# Patient Record
Sex: Female | Born: 1985 | Race: White | Hispanic: No | Marital: Single | Smoking: Never smoker
Health system: Southern US, Community
[De-identification: ages and names within clinical notes are randomized; demographics above are authoritative.]

## PROBLEM LIST (undated history)

## (undated) ENCOUNTER — Inpatient Hospital Stay (HOSPITAL_COMMUNITY): Payer: Self-pay

## (undated) DIAGNOSIS — N898 Other specified noninflammatory disorders of vagina: Secondary | ICD-10-CM

## (undated) DIAGNOSIS — O24419 Gestational diabetes mellitus in pregnancy, unspecified control: Secondary | ICD-10-CM

## (undated) DIAGNOSIS — Z789 Other specified health status: Secondary | ICD-10-CM

## (undated) DIAGNOSIS — E119 Type 2 diabetes mellitus without complications: Secondary | ICD-10-CM

## (undated) DIAGNOSIS — N760 Acute vaginitis: Secondary | ICD-10-CM

## (undated) DIAGNOSIS — N946 Dysmenorrhea, unspecified: Secondary | ICD-10-CM

## (undated) DIAGNOSIS — B9689 Other specified bacterial agents as the cause of diseases classified elsewhere: Secondary | ICD-10-CM

## (undated) DIAGNOSIS — N888 Other specified noninflammatory disorders of cervix uteri: Secondary | ICD-10-CM

## (undated) DIAGNOSIS — N923 Ovulation bleeding: Principal | ICD-10-CM

## (undated) HISTORY — DX: Other specified bacterial agents as the cause of diseases classified elsewhere: B96.89

## (undated) HISTORY — DX: Other specified noninflammatory disorders of cervix uteri: N88.8

## (undated) HISTORY — DX: Dysmenorrhea, unspecified: N94.6

## (undated) HISTORY — DX: Ovulation bleeding: N92.3

## (undated) HISTORY — DX: Other specified health status: Z78.9

## (undated) HISTORY — DX: Gestational diabetes mellitus in pregnancy, unspecified control: O24.419

## (undated) HISTORY — DX: Other specified noninflammatory disorders of vagina: N89.8

## (undated) HISTORY — PX: ORTHOPEDIC SURGERY: SHX850

## (undated) HISTORY — DX: Acute vaginitis: N76.0

## (undated) HISTORY — DX: Type 2 diabetes mellitus without complications: E11.9

---
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
## 2006-01-15 ENCOUNTER — Inpatient Hospital Stay (HOSPITAL_COMMUNITY): Admission: AD | Admit: 2006-01-15 | Discharge: 2006-01-17 | Payer: Self-pay | Admitting: Gynecology

## 2006-03-02 ENCOUNTER — Other Ambulatory Visit: Admission: RE | Admit: 2006-03-02 | Discharge: 2006-03-02 | Payer: Self-pay | Admitting: Gynecology

## 2007-06-24 ENCOUNTER — Emergency Department (HOSPITAL_COMMUNITY): Admission: EM | Admit: 2007-06-24 | Discharge: 2007-06-24 | Payer: Self-pay | Admitting: Emergency Medicine

## 2007-07-10 ENCOUNTER — Emergency Department (HOSPITAL_COMMUNITY): Admission: EM | Admit: 2007-07-10 | Discharge: 2007-07-10 | Payer: Self-pay | Admitting: Emergency Medicine

## 2010-06-30 ENCOUNTER — Other Ambulatory Visit: Payer: Self-pay | Admitting: Obstetrics & Gynecology

## 2010-06-30 ENCOUNTER — Other Ambulatory Visit (HOSPITAL_COMMUNITY)
Admission: RE | Admit: 2010-06-30 | Discharge: 2010-06-30 | Disposition: A | Discharge status: Home / Self Care | Payer: Self-pay | Source: Ambulatory Visit | Attending: Obstetrics & Gynecology | Admitting: Obstetrics & Gynecology

## 2010-06-30 DIAGNOSIS — Z01419 Encounter for gynecological examination (general) (routine) without abnormal findings: Secondary | ICD-10-CM | POA: Insufficient documentation

## 2010-08-29 NOTE — Op Note (Signed)
[name], [name]             ACCOUNT NO.:  [id number]   MEDICAL RECORD NO.:  [id number]          PATIENT TYPE:  OIB   LOCATION:  2899                         FACILITY:  [facility]   PHYSICIAN:  Jon Gills, M.D.  DATE OF BIRTH:  1985-12-02   DATE OF PROCEDURE:  04/18/2004  DATE OF DISCHARGE:  04/18/2004                                 OPERATIVE REPORT   PREOPERATIVE DIAGNOSIS:  Lower gastrointestinal bleeding, undetermined  cause.   POSTOPERATIVE DIAGNOSIS:  Mild proctitis, undetermined cause.   NAME OF OPERATION:  Colonoscopy with biopsy.   SURGEON:  Jon Gills, M.D.   ASSISTANTS:  None.   DESCRIPTION OF FINDINGS:  Following informed written consent, the patient  was taken to the operating room and placed under general anesthesia with  continuous cardiopulmonary monitoring.  She remained in the supine position  and examination of the perineum revealed no tags or fissures.  Digital  examination revealed an empty rectal vault.  The Olympus PCF-160 coloscope  was inserted per rectum and advanced 100 cm to the hepatic flexure.  Mild  inflammation was noted in the first 10 cm of colon without spontaneous  friability.  The remainder of the mucosa was completely normal.   There was no evidence of blood, polyps or vascular abnormalities.  Multiple  biopsies were taken throughout the colon at 100 cm, 50 cm and 15 cm from the  rectum.  The colonoscope was then withdrawn and the patient was awaken and  taken to the recovery room in satisfactory condition.  She will be released  later today to the care of her parents.   DESCRIPTION OF TECHNIQUE PROCEDURES USED:  Olympus PCF-160 colonoscope with  cold biopsy forceps.   DESCRIPTION OF SPECIMENS REMOVED:  Colon biopsies x 8 (two at 100 cm, two at  50 cm and four at 15 cm from the rectum).      JHC/MEDQ  D:  05/08/2004  T:  05/08/2004  Job:  [id number]   cc:   Gaetano Hawthorne. Lily Peer, M.D.  [address], [address]  [place]  [place] [address]  Fax: [phone]   Vale Haven. Andrey Campanile, M.D.  [address]  [place]  [place] [address]  Fax: [phone]

## 2010-08-29 NOTE — Op Note (Signed)
[facility]  Patient:    [name], [name] Visit Number: [id number] MRN: [id number]          Service Type: DSU Location: [facility] Attending Physician:  Georgena Spurling Dictated by:   Georgena Spurling, M.D. Proc. Date: 02/16/01 Admit Date:  02/16/2001                             Operative Report  PREOPERATIVE DIAGNOSIS:  Left knee patella instability.  POSTOPERATIVE DIAGNOSIS:  Left knee patella instability.  PROCEDURE:  Left knee arthroscopy with arthroscopic lateral release.  SURGEON:  Georgena Spurling, M.D.  ANESTHESIA:  General LMA.  INDICATION FOR PROCEDURE:  The patient is a 25 year old status post subluxation of the knee and dislocation of the patella.  Informed consent was obtained.  DESCRIPTION OF PROCEDURE:  The patient was laid supine and administered general LMA anesthesia.  The left lower extremity was prepped and draped in the usual sterile fashion.  Inferior lateral and inferior medial portals were created with a #11 blade, blunt trocar, and cannula.  Diagnostic arthroscopy revealed an avulsion of cartilage off of the medial facet of the patella during a dislocation episode.  There was also an extreme amount of synovitis. ACL, PCL were normal.  The medial and lateral menisci were normal.  There was a loose piece of cartilage down in the intercondylar notch, and this was removed.  then used the Arthrocare wand to perform a lateral release.  What was quite noticeable was that the patient had very little, if any, true sulcus in the trochlear area.  This was obviously the etiology of her patellar instability. The kneecap was tracking somewhat laterally, and the lateral release did help; however, during the tracking process, although it was central, there was no sulcus, really no congruity of the patella, for the patella to track in.  The patella looked pretty good.  It had some softening in the lateral facet.  The knee was then lavaged.   Both portals were closed with interrupted 4-0 nylon sutures and infiltrated with 10 cc of a Marcaine-morphine mixture.  The wounds were dressed with a Xeroform dressing, sponges, sterile Webril, and an Ace wrap.  Tourniquet time:  None. Complications:  None.  Drains:  None. Dictated by:   Georgena Spurling, M.D. Attending Physician:  Georgena Spurling DD:  02/16/01 TD:  02/17/01 Job: [id number]

## 2010-08-29 NOTE — H&P (Signed)
[name], [name]             ACCOUNT NO.:  [id number]   MEDICAL RECORD NO.:  [id number]          PATIENT TYPE:  INP   LOCATION:                                FACILITY:  [facility]   PHYSICIAN:  Juan H. Lily Peer, M.D.DATE OF BIRTH:  01/18/2005   DATE OF ADMISSION:  DATE OF DISCHARGE:                                HISTORY & PHYSICAL   CHIEF COMPLAINT:  Postdate pregnancy.   HISTORY:  The patient is an 25 year old gravida 1, para 0 with uncertain  last menstrual period.  Ultrasound demonstrated her due date to be January 10, 2006.  She was seen in the office today for a routine OB visit.  She is  currently 40-1/[redacted] weeks gestation and her cervix was found to be 2 cm, 80%  effaced and -3 station.  Positive fetal movements were documented.  The  patient has had a benign prenatal course, had declined first trimester  screening and had Ascus on her Pap smear and negative HPV and we will follow  the Pap smear postpartum.  She is Rubella negative.  So, we will have to  treat her postpartum with a vaccination.   PAST MEDICAL HISTORY:  The patient denies any allergies.  No medical  problems reported.  She is on prenatal vitamins.   REVIEW OF SYSTEMS:  See [employer] form.   PHYSICAL EXAMINATION:  Weight 160 pounds.  Urine was negative for protein  and glucose.  Blood pressure was 130/80.  HEENT:  Unremarkable.  NECK:  Supple.  Trachea midline.  No carotid bruits.  No thyromegaly.  LUNGS:  Clear to auscultation without rhonchi or wheezes.  HEART:  Regular rate and rhythm.  No murmurs or gallops.  BREASTS:  Exam not done.  ABDOMEN:  Gravid uterus.  Vertex presentation by [facility] maneuver.  PELVIC:  Cervix 2 cm, 80% effaced, -3 station.  EXTREMITIES:  DTR 1+, negative clonus.   PRENATAL LABS:  A positive blood type, negative antibody screen.  VDRL was  nonreactive.  Rubella negative.  Hepatitis B surface antigen and HIV were  negative.  Alphafetaprotein was normal.  Diabetes screen was  normal.  GBS  and culture was negative.  The patient declined first trimester screening  and cystic fibrosis screening.  A group B strep culture was negative.   ASSESSMENT:  An 25 year old gravida 1, para 0 at [redacted] weeks gestation.  At  this time, she will be admitted to the hospital on the morning of January 18, 2006 for Pitocin and rupturing of membranes.  She has a favorable cervix at  2  cm, 80% effaced, -3 station.  Risks, benefits, pros and cons were discussed.  Group B strep culture was negative.  We will follow accordingly.   PLAN:  As per assessment above.      Juan H. Lily Peer, M.D.  Electronically Signed     JHF/MEDQ  D:  01/14/2006  T:  01/14/2006  Job:  [id number]

## 2011-01-05 LAB — URINALYSIS, ROUTINE W REFLEX MICROSCOPIC
Bilirubin Urine: NEGATIVE
Bilirubin Urine: NEGATIVE
Glucose, UA: NEGATIVE
Glucose, UA: NEGATIVE
Hgb urine dipstick: NEGATIVE
Hgb urine dipstick: NEGATIVE
Ketones, ur: NEGATIVE
Nitrite: NEGATIVE
Protein, ur: NEGATIVE
Specific Gravity, Urine: 1.02
Specific Gravity, Urine: >1.03 — ABNORMAL HIGH
Urobilinogen, UA: 0.2
Urobilinogen, UA: 0.2
pH: 7.5

## 2011-01-05 LAB — URINE MICROSCOPIC-ADD ON

## 2011-01-05 LAB — PREGNANCY, URINE: Preg Test, Ur: NEGATIVE

## 2011-01-05 LAB — URINE CULTURE: Colony Count: >=100000

## 2011-02-19 ENCOUNTER — Other Ambulatory Visit: Payer: Self-pay | Admitting: Adult Health

## 2011-02-19 DIAGNOSIS — N63 Unspecified lump in unspecified breast: Secondary | ICD-10-CM

## 2011-02-25 ENCOUNTER — Ambulatory Visit (HOSPITAL_COMMUNITY)
Admission: RE | Admit: 2011-02-25 | Discharge: 2011-02-25 | Disposition: A | Discharge status: Home / Self Care | Payer: BC Managed Care – PPO | Source: Ambulatory Visit | Attending: Adult Health | Admitting: Adult Health

## 2011-02-25 DIAGNOSIS — N63 Unspecified lump in unspecified breast: Secondary | ICD-10-CM | POA: Insufficient documentation

## 2012-04-13 NOTE — L&D Delivery Note (Signed)
Delivery Note At 9:33 AM a viable female was delivered via Vaginal, Spontaneous Delivery (Presentation: Right Occiput Anterior).  APGAR: 9, 9; weight TBD.   Placenta status: Intact, Spontaneous.  Cord: 3 vessels with the following complications: None.  Cord pH: not sent  Anesthesia: Epidural  Episiotomy: None Lacerations: 1st degree Suture Repair: 3.0 vicryl Est. Blood Loss (mL): 300  Mom to postpartum.  Baby to nursery-stable.  Marissa Calamity 01/02/2013, 9:48 AM   I was present for and supervised the delivery of this newborn by the resident. Pt pushed with good maternal effort to deliver a liveborn female with spontaneous cry. 1st degree repaired with figure of 8 stitch for hemostasis. Placenta  Delivered spontaneously intact.  Mom and baby doing well.   Kaitlyn Skowron, Redmond Baseman, MD

## 2012-05-04 ENCOUNTER — Emergency Department (HOSPITAL_COMMUNITY): Payer: BC Managed Care – PPO

## 2012-05-04 ENCOUNTER — Encounter (HOSPITAL_COMMUNITY): Payer: Self-pay | Admitting: *Deleted

## 2012-05-04 ENCOUNTER — Emergency Department (HOSPITAL_COMMUNITY)
Admission: EM | Admit: 2012-05-04 | Discharge: 2012-05-04 | Disposition: A | Discharge status: Home / Self Care | Payer: BC Managed Care – PPO | Attending: Emergency Medicine | Admitting: Emergency Medicine

## 2012-05-04 DIAGNOSIS — Z3201 Encounter for pregnancy test, result positive: Secondary | ICD-10-CM | POA: Insufficient documentation

## 2012-05-04 DIAGNOSIS — N390 Urinary tract infection, site not specified: Secondary | ICD-10-CM | POA: Insufficient documentation

## 2012-05-04 DIAGNOSIS — R110 Nausea: Secondary | ICD-10-CM | POA: Insufficient documentation

## 2012-05-04 LAB — CBC WITH DIFFERENTIAL/PLATELET
Basophils Relative: 0 % (ref 0–1)
Eosinophils Absolute: 0.1 10*3/uL (ref 0.0–0.7)
HCT: 39.3 % (ref 36.0–46.0)
Hemoglobin: 13.2 g/dL (ref 12.0–15.0)
Lymphs Abs: 2.2 10*3/uL (ref 0.7–4.0)
MCH: 30.9 pg (ref 26.0–34.0)
MCHC: 33.6 g/dL (ref 30.0–36.0)
MCV: 92 fL (ref 78.0–100.0)
Monocytes Absolute: 0.9 10*3/uL (ref 0.1–1.0)
Monocytes Relative: 10 % (ref 3–12)
RBC: 4.27 MIL/uL (ref 3.87–5.11)

## 2012-05-04 LAB — URINALYSIS, ROUTINE W REFLEX MICROSCOPIC
Glucose, UA: NEGATIVE mg/dL
Ketones, ur: NEGATIVE mg/dL
Nitrite: NEGATIVE
Protein, ur: NEGATIVE mg/dL
pH: 8 (ref 5.0–8.0)

## 2012-05-04 LAB — COMPREHENSIVE METABOLIC PANEL
Albumin: 3.8 g/dL (ref 3.5–5.2)
Alkaline Phosphatase: 71 U/L (ref 39–117)
BUN: 10 mg/dL (ref 6–23)
Creatinine, Ser: 0.73 mg/dL (ref 0.50–1.10)
GFR calc Af Amer: >90 mL/min (ref >90)
Glucose, Bld: 89 mg/dL (ref 70–99)
Potassium: 3.5 mEq/L (ref 3.5–5.1)
Total Bilirubin: 0.6 mg/dL (ref 0.3–1.2)
Total Protein: 7.4 g/dL (ref 6.0–8.3)

## 2012-05-04 LAB — HCG, QUANTITATIVE, PREGNANCY: hCG, Beta Chain, Quant, S: 5074 m[IU]/mL — ABNORMAL HIGH (ref <5)

## 2012-05-04 LAB — URINE MICROSCOPIC-ADD ON

## 2012-05-04 MED ORDER — CEPHALEXIN 500 MG PO CAPS: 500.0000 mg | ORAL_CAPSULE | Freq: Four times a day (QID) | ORAL | Status: DC

## 2012-05-04 NOTE — ED Provider Notes (Signed)
History   This chart was scribed for Joann Lennert, MD by Charolett Bumpers, ED Scribe. The patient was seen in room APA17/APA17. Patient's care was started at 1121.   CSN: [id number]  Arrival date & time 05/04/12  1111   First MD Initiated Contact with Patient 05/04/12 1121      Chief Complaint  Patient presents with  . Abdominal Pain   [name] is a 27 y.o. female who presents to the Emergency Department complaining of lower abdominal pain. She states her pain started 2 days ago. She took a gas-x which resolved her symptoms. She states her pain returned this morning and has not stopped since. She describes the pain as pressure. She reports some mild nausea and notes she has had an episode of mild blood in her stool. She denies any fever, chills, vomiting, diarrhea, dysuria or increased urinary frequency. She denies any abdominal surgery hx. LNMP was 04/10/12  Patient is a 27 y.o. female presenting with abdominal pain. The history is provided by the patient. No language interpreter was used.  Abdominal Pain The primary symptoms of the illness include abdominal pain and nausea. The primary symptoms of the illness do not include fever, fatigue, vomiting or diarrhea.  The abdominal pain began 2 days ago. The pain came on gradually. The abdominal pain has been gradually worsening since its onset. The abdominal pain is located in the LLQ and suprapubic region. The abdominal pain does not radiate.  Symptoms associated with the illness do not include chills, hematuria, frequency or back pain.    History reviewed. No pertinent past medical history.  Past Surgical History  Procedure Date  . Orthopedic surgery     No family history on file.  History  Substance Use Topics  . Smoking status: Never Smoker   . Smokeless tobacco: Not on file  . Alcohol Use: Yes     Comment: OCC    OB History    Grav Para Term Preterm Abortions TAB SAB Ect Mult Living                   Review of Systems  Constitutional: Negative for fever, chills and fatigue.  HENT: Negative for congestion, sinus pressure and ear discharge.   Eyes: Negative for discharge.  Respiratory: Negative for cough.   Cardiovascular: Negative for chest pain.  Gastrointestinal: Positive for nausea, abdominal pain and blood in stool. Negative for vomiting and diarrhea.  Genitourinary: Negative for frequency and hematuria.  Musculoskeletal: Negative for back pain.  Skin: Negative for rash.  Neurological: Negative for seizures and headaches.  Hematological: Negative.   Psychiatric/Behavioral: Negative for hallucinations.  All other systems reviewed and are negative.    Allergies  Review of patient's allergies indicates no known allergies.  Home Medications  No current outpatient prescriptions on file.  BP 121/62  Pulse 83  Temp 98.1 F (36.7 C) (Oral)  Resp 20  Ht 5\' 2"  (1.575 m)  Wt 185 lb (83.915 kg)  BMI 33.84 kg/m2  SpO2 97%  LMP 04/10/2012  Physical Exam  Nursing note and vitals reviewed. Constitutional: She is oriented to person, place, and time. She appears well-developed.  HENT:  Head: Normocephalic and atraumatic.  Eyes: Conjunctivae normal and EOM are normal. No scleral icterus.  Neck: Neck supple. No thyromegaly present.  Cardiovascular: Normal rate, regular rhythm and normal heart sounds.  Exam reveals no gallop and no friction rub.   No murmur heard. Pulmonary/Chest: Effort normal and breath sounds normal.  No stridor. She has no wheezes. She has no rales. She exhibits no tenderness.  Abdominal: Soft. She exhibits no distension. There is tenderness. There is no rebound.       Minimal LLQ and suprapubic tenderness.   Genitourinary: Cervix exhibits motion tenderness. Left adnexum displays tenderness. Vaginal discharge found.       Cervical and left adnexa tenderness with mild discharge noted.   Musculoskeletal: Normal range of motion. She exhibits no edema.   Lymphadenopathy:    She has no cervical adenopathy.  Neurological: She is oriented to person, place, and time. Coordination normal.  Skin: No rash noted. No erythema.  Psychiatric: She has a normal mood and affect. Her behavior is normal.    ED Course  Procedures (including critical care time)  DIAGNOSTIC STUDIES: Oxygen Saturation is 97% on room air, adequate by my interpretation.    COORDINATION OF CARE:  11:27-Discussed planned course of treatment with the patient including blood work and UA, who is agreeable at this time.   12:08-Preformed pelvic exam with chaperon present.   Results for orders placed during the hospital encounter of 05/04/12  PREGNANCY, URINE      Component Value Range   Preg Test, Ur POSITIVE (*) NEGATIVE  CBC WITH DIFFERENTIAL      Component Value Range   WBC 8.9  4.0 - 10.5 K/uL   RBC 4.27  3.87 - 5.11 MIL/uL   Hemoglobin 13.2  12.0 - 15.0 g/dL   HCT 16.1  09.6 - 04.5 %   MCV 92.0  78.0 - 100.0 fL   MCH 30.9  26.0 - 34.0 pg   MCHC 33.6  30.0 - 36.0 g/dL   RDW 40.9  81.1 - 91.4 %   Platelets 420 (*) 150 - 400 K/uL   Neutrophils Relative 64  43 - 77 %   Neutro Abs 5.6  1.7 - 7.7 K/uL   Lymphocytes Relative 25  12 - 46 %   Lymphs Abs 2.2  0.7 - 4.0 K/uL   Monocytes Relative 10  3 - 12 %   Monocytes Absolute 0.9  0.1 - 1.0 K/uL   Eosinophils Relative 1  0 - 5 %   Eosinophils Absolute 0.1  0.0 - 0.7 K/uL   Basophils Relative 0  0 - 1 %   Basophils Absolute 0.0  0.0 - 0.1 K/uL  COMPREHENSIVE METABOLIC PANEL      Component Value Range   Sodium 134 (*) 135 - 145 mEq/L   Potassium 3.5  3.5 - 5.1 mEq/L   Chloride 99  96 - 112 mEq/L   CO2 24  19 - 32 mEq/L   Glucose, Bld 89  70 - 99 mg/dL   BUN 10  6 - 23 mg/dL   Creatinine, Ser 7.82  0.50 - 1.10 mg/dL   Calcium 9.3  8.4 - 95.6 mg/dL   Total Protein 7.4  6.0 - 8.3 g/dL   Albumin 3.8  3.5 - 5.2 g/dL   AST 24  0 - 37 U/L   ALT 22  0 - 35 U/L   Alkaline Phosphatase 71  39 - 117 U/L   Total  Bilirubin 0.6  0.3 - 1.2 mg/dL   GFR calc non Af Amer >90  >90 mL/min   GFR calc Af Amer >90  >90 mL/min  URINALYSIS, ROUTINE W REFLEX MICROSCOPIC      Component Value Range   Color, Urine YELLOW  YELLOW   APPearance CLEAR  CLEAR   Specific  Gravity, Urine 1.020  1.005 - 1.030   pH 8.0  5.0 - 8.0   Glucose, UA NEGATIVE  NEGATIVE mg/dL   Hgb urine dipstick NEGATIVE  NEGATIVE   Bilirubin Urine NEGATIVE  NEGATIVE   Ketones, ur NEGATIVE  NEGATIVE mg/dL   Protein, ur NEGATIVE  NEGATIVE mg/dL   Urobilinogen, UA 0.2  0.0 - 1.0 mg/dL   Nitrite NEGATIVE  NEGATIVE   Leukocytes, UA SMALL (*) NEGATIVE  URINE MICROSCOPIC-ADD ON      Component Value Range   Squamous Epithelial / LPF FEW (*) RARE   WBC, UA 3-6  <3 WBC/hpf   Bacteria, UA MANY (*) RARE  HCG, QUANTITATIVE, PREGNANCY      Component Value Range   hCG, Beta Chain, Quant, S 5074 (*) <5 mIU/mL    No results found.   No diagnosis found.    MDM      The chart was scribed for me under my direct supervision.  I personally performed the history, physical, and medical decision making and all procedures in the evaluation of this patient.Joann Lennert, MD 05/04/12 [phone]

## 2012-05-04 NOTE — ED Notes (Signed)
Abdominal pain began Monday, to lower abdomen. Woke up this morning with pressure  To lower abdomen and slight nausea. NAD.

## 2012-05-05 LAB — URINE CULTURE: Colony Count: 40000

## 2012-06-03 ENCOUNTER — Encounter: Payer: Self-pay | Admitting: Adult Health

## 2012-06-03 ENCOUNTER — Other Ambulatory Visit: Payer: Self-pay | Admitting: Adult Health

## 2012-06-03 ENCOUNTER — Other Ambulatory Visit (HOSPITAL_COMMUNITY)
Admission: RE | Admit: 2012-06-03 | Discharge: 2012-06-03 | Disposition: A | Discharge status: Home / Self Care | Payer: BC Managed Care – PPO | Source: Ambulatory Visit | Attending: Obstetrics and Gynecology | Admitting: Obstetrics and Gynecology

## 2012-06-03 DIAGNOSIS — Z01419 Encounter for gynecological examination (general) (routine) without abnormal findings: Secondary | ICD-10-CM | POA: Insufficient documentation

## 2012-06-03 DIAGNOSIS — Z113 Encounter for screening for infections with a predominantly sexual mode of transmission: Secondary | ICD-10-CM | POA: Insufficient documentation

## 2012-06-03 DIAGNOSIS — O2341 Unspecified infection of urinary tract in pregnancy, first trimester: Secondary | ICD-10-CM

## 2012-06-20 ENCOUNTER — Other Ambulatory Visit: Payer: Self-pay | Admitting: Obstetrics & Gynecology

## 2012-06-20 DIAGNOSIS — Z36 Encounter for antenatal screening of mother: Secondary | ICD-10-CM

## 2012-06-30 ENCOUNTER — Ambulatory Visit (INDEPENDENT_AMBULATORY_CARE_PROVIDER_SITE_OTHER): Payer: BC Managed Care – PPO

## 2012-06-30 ENCOUNTER — Ambulatory Visit (INDEPENDENT_AMBULATORY_CARE_PROVIDER_SITE_OTHER): Payer: BC Managed Care – PPO | Admitting: Obstetrics & Gynecology

## 2012-06-30 ENCOUNTER — Other Ambulatory Visit: Payer: Self-pay | Admitting: Obstetrics & Gynecology

## 2012-06-30 VITALS — BP 120/60 | Wt 203.0 lb

## 2012-06-30 DIAGNOSIS — Z3490 Encounter for supervision of normal pregnancy, unspecified, unspecified trimester: Secondary | ICD-10-CM

## 2012-06-30 DIAGNOSIS — Z36 Encounter for antenatal screening of mother: Secondary | ICD-10-CM

## 2012-06-30 DIAGNOSIS — Z3480 Encounter for supervision of other normal pregnancy, unspecified trimester: Secondary | ICD-10-CM

## 2012-06-30 LAB — POCT URINALYSIS DIPSTICK
Leukocytes, UA: NEGATIVE
Nitrite, UA: NEGATIVE
Protein, UA: NEGATIVE

## 2012-06-30 NOTE — Patient Instructions (Signed)
Pregnancy - Second Trimester The second trimester of pregnancy (3 to 6 months) is a period of rapid growth for you and your baby. At the end of the sixth month, your baby is about 9 inches long and weighs 1 1/2 pounds. You will begin to feel the baby move between 18 and 20 weeks of the pregnancy. This is called quickening. Weight gain is faster. A clear fluid (colostrum) may leak out of your breasts. You may feel small contractions of the womb (uterus). This is known as false labor or Braxton-Hicks contractions. This is like a practice for labor when the baby is ready to be born. Usually, the problems with morning sickness have usually passed by the end of your first trimester. Some women develop small dark blotches (called cholasma, mask of pregnancy) on their face that usually goes away after the baby is born. Exposure to the sun makes the blotches worse. Acne may also develop in some pregnant women and pregnant women who have acne, may find that it goes away. PRENATAL EXAMS  Blood work may continue to be done during prenatal exams. These tests are done to check on your health and the probable health of your baby. Blood work is used to follow your blood levels (hemoglobin). Anemia (low hemoglobin) is common during pregnancy. Iron and vitamins are given to help prevent this. You will also be checked for diabetes between 24 and 28 weeks of the pregnancy. Some of the previous blood tests may be repeated.  The size of the uterus is measured during each visit. This is to make sure that the baby is continuing to grow properly according to the dates of the pregnancy.  Your blood pressure is checked every prenatal visit. This is to make sure you are not getting toxemia.  Your urine is checked to make sure you do not have an infection, diabetes or protein in the urine.  Your weight is checked often to make sure gains are happening at the suggested rate. This is to ensure that both you and your baby are growing  normally.  Sometimes, an ultrasound is performed to confirm the proper growth and development of the baby. This is a test which bounces harmless sound waves off the baby so your caregiver can more accurately determine due dates. Sometimes, a specialized test is done on the amniotic fluid surrounding the baby. This test is called an amniocentesis. The amniotic fluid is obtained by sticking a needle into the belly (abdomen). This is done to check the chromosomes in instances where there is a concern about possible genetic problems with the baby. It is also sometimes done near the end of pregnancy if an early delivery is required. In this case, it is done to help make sure the baby's lungs are mature enough for the baby to live outside of the womb. CHANGES OCCURING IN THE SECOND TRIMESTER OF PREGNANCY Your body goes through many changes during pregnancy. They vary from person to person. Talk to your caregiver about changes you notice that you are concerned about.  During the second trimester, you will likely have an increase in your appetite. It is normal to have cravings for certain foods. This varies from person to person and pregnancy to pregnancy.  Your lower abdomen will begin to bulge.  You may have to urinate more often because the uterus and baby are pressing on your bladder. It is also common to get more bladder infections during pregnancy (pain with urination). You can help this by   drinking lots of fluids and emptying your bladder before and after intercourse.  You may begin to get stretch marks on your hips, abdomen, and breasts. These are normal changes in the body during pregnancy. There are no exercises or medications to take that prevent this change.  You may begin to develop swollen and bulging veins (varicose veins) in your legs. Wearing support hose, elevating your feet for 15 minutes, 3 to 4 times a day and limiting salt in your diet helps lessen the problem.  Heartburn may develop  as the uterus grows and pushes up against the stomach. Antacids recommended by your caregiver helps with this problem. Also, eating smaller meals 4 to 5 times a day helps.  Constipation can be treated with a stool softener or adding bulk to your diet. Drinking lots of fluids, vegetables, fruits, and whole grains are helpful.  Exercising is also helpful. If you have been very active up until your pregnancy, most of these activities can be continued during your pregnancy. If you have been less active, it is helpful to start an exercise program such as walking.  Hemorrhoids (varicose veins in the rectum) may develop at the end of the second trimester. Warm sitz baths and hemorrhoid cream recommended by your caregiver helps hemorrhoid problems.  Backaches may develop during this time of your pregnancy. Avoid heavy lifting, wear low heal shoes and practice good posture to help with backache problems.  Some pregnant women develop tingling and numbness of their hand and fingers because of swelling and tightening of ligaments in the wrist (carpel tunnel syndrome). This goes away after the baby is born.  As your breasts enlarge, you may have to get a bigger bra. Get a comfortable, cotton, support bra. Do not get a nursing bra until the last month of the pregnancy if you will be nursing the baby.  You may get a dark line from your belly button to the pubic area called the linea nigra.  You may develop rosy cheeks because of increase blood flow to the face.  You may develop spider looking lines of the face, neck, arms and chest. These go away after the baby is born. HOME CARE INSTRUCTIONS   It is extremely important to avoid all smoking, herbs, alcohol, and unprescribed drugs during your pregnancy. These chemicals affect the formation and growth of the baby. Avoid these chemicals throughout the pregnancy to ensure the delivery of a healthy infant.  Most of your home care instructions are the same as  suggested for the first trimester of your pregnancy. Keep your caregiver's appointments. Follow your caregiver's instructions regarding medication use, exercise and diet.  During pregnancy, you are providing food for you and your baby. Continue to eat regular, well-balanced meals. Choose foods such as meat, fish, milk and other low fat dairy products, vegetables, fruits, and whole-grain breads and cereals. Your caregiver will tell you of the ideal weight gain.  A physical sexual relationship may be continued up until near the end of pregnancy if there are no other problems. Problems could include early (premature) leaking of amniotic fluid from the membranes, vaginal bleeding, abdominal pain, or other medical or pregnancy problems.  Exercise regularly if there are no restrictions. Check with your caregiver if you are unsure of the safety of some of your exercises. The greatest weight gain will occur in the last 2 trimesters of pregnancy. Exercise will help you:  Control your weight.  Get you in shape for labor and delivery.  Lose weight   after you have the baby.  Wear a good support or jogging bra for breast tenderness during pregnancy. This may help if worn during sleep. Pads or tissues may be used in the bra if you are leaking colostrum.  Do not use hot tubs, steam rooms or saunas throughout the pregnancy.  Wear your seat belt at all times when driving. This protects you and your baby if you are in an accident.  Avoid raw meat, uncooked cheese, cat litter boxes and soil used by cats. These carry germs that can cause birth defects in the baby.  The second trimester is also a good time to visit your dentist for your dental health if this has not been done yet. Getting your teeth cleaned is OK. Use a soft toothbrush. Brush gently during pregnancy.  It is easier to loose urine during pregnancy. Tightening up and strengthening the pelvic muscles will help with this problem. Practice stopping your  urination while you are going to the bathroom. These are the same muscles you need to strengthen. It is also the muscles you would use as if you were trying to stop from passing gas. You can practice tightening these muscles up 10 times a set and repeating this about 3 times per day. Once you know what muscles to tighten up, do not perform these exercises during urination. It is more likely to contribute to an infection by backing up the urine.  Ask for help if you have financial, counseling or nutritional needs during pregnancy. Your caregiver will be able to offer counseling for these needs as well as refer you for other special needs.  Your skin may become oily. If so, wash your face with mild soap, use non-greasy moisturizer and oil or cream based makeup. MEDICATIONS AND DRUG USE IN PREGNANCY  Take prenatal vitamins as directed. The vitamin should contain 1 milligram of folic acid. Keep all vitamins out of reach of children. Only a couple vitamins or tablets containing iron may be fatal to a baby or young child when ingested.  Avoid use of all medications, including herbs, over-the-counter medications, not prescribed or suggested by your caregiver. Only take over-the-counter or prescription medicines for pain, discomfort, or fever as directed by your caregiver. Do not use aspirin.  Let your caregiver also know about herbs you may be using.  Alcohol is related to a number of birth defects. This includes fetal alcohol syndrome. All alcohol, in any form, should be avoided completely. Smoking will cause low birth rate and premature babies.  Street or illegal drugs are very harmful to the baby. They are absolutely forbidden. A baby born to an addicted mother will be addicted at birth. The baby will go through the same withdrawal an adult does. SEEK MEDICAL CARE IF:  You have any concerns or worries during your pregnancy. It is better to call with your questions if you feel they cannot wait, rather  than worry about them. SEEK IMMEDIATE MEDICAL CARE IF:   An unexplained oral temperature above 102 F (38.9 C) develops, or as your caregiver suggests.  You have leaking of fluid from the vagina (birth canal). If leaking membranes are suspected, take your temperature and tell your caregiver of this when you call.  There is vaginal spotting, bleeding, or passing clots. Tell your caregiver of the amount and how many pads are used. Light spotting in pregnancy is common, especially following intercourse.  You develop a bad smelling vaginal discharge with a change in the color from clear   to white.  You continue to feel sick to your stomach (nauseated) and have no relief from remedies suggested. You vomit blood or coffee ground-like materials.  You lose more than 2 pounds of weight or gain more than 2 pounds of weight over 1 week, or as suggested by your caregiver.  You notice swelling of your face, hands, feet, or legs.  You get exposed to German measles and have never had them.  You are exposed to fifth disease or chickenpox.  You develop belly (abdominal) pain. Round ligament discomfort is a common non-cancerous (benign) cause of abdominal pain in pregnancy. Your caregiver still must evaluate you.  You develop a bad headache that does not go away.  You develop fever, diarrhea, pain with urination, or shortness of breath.  You develop visual problems, blurry, or double vision.  You fall or are in a car accident or any kind of trauma.  There is mental or physical violence at home. Document Released: 03/24/2001 Document Revised: 06/22/2011 Document Reviewed: 09/26/2008 [employer] Patient Information 2013 [employer].  

## 2012-06-30 NOTE — Progress Notes (Signed)
Patient is without complaints except back pain, which after my manipulation is improved.  Discussed her mattress and pillows.  No bleeding no nausea.  Sono reviewed with patient.  1st IT done today 2nd TBD next visit.

## 2012-06-30 NOTE — Progress Notes (Deleted)
U/S @ 13+2wks-single active fetus, CRL c/w Dates, NB present, NT=1.50mm, cx appears long and closed, bilateral adnexa wnl

## 2012-06-30 NOTE — Progress Notes (Signed)
Pt c/o back and abd. Pain.

## 2012-06-30 NOTE — Progress Notes (Signed)
U/S @ 13+2wks-single active fetus, CRL c/w Dates, NB present, NT=1.49mm, cx appears long and closed, bilateral adnexa wnl 

## 2012-07-07 LAB — MATERNAL SCREEN, INTEGRATED #1
Maternal weight: 203 [lb_av]
Nuchal translucency: 1.49 mm
Number of fetuses: 1

## 2012-07-28 ENCOUNTER — Ambulatory Visit (INDEPENDENT_AMBULATORY_CARE_PROVIDER_SITE_OTHER): Payer: BC Managed Care – PPO | Admitting: Advanced Practice Midwife

## 2012-07-28 ENCOUNTER — Other Ambulatory Visit: Payer: Self-pay | Admitting: Advanced Practice Midwife

## 2012-07-28 ENCOUNTER — Encounter: Payer: Self-pay | Admitting: Advanced Practice Midwife

## 2012-07-28 VITALS — BP 100/56 | Wt 206.0 lb

## 2012-07-28 DIAGNOSIS — Z3482 Encounter for supervision of other normal pregnancy, second trimester: Secondary | ICD-10-CM

## 2012-07-28 DIAGNOSIS — Z3480 Encounter for supervision of other normal pregnancy, unspecified trimester: Secondary | ICD-10-CM | POA: Insufficient documentation

## 2012-07-28 LAB — POCT URINALYSIS DIPSTICK
Blood, UA: NEGATIVE
Nitrite, UA: NEGATIVE
Protein, UA: NEGATIVE

## 2012-07-28 NOTE — Progress Notes (Signed)
No c/o at this time.  Routine questions about pregnancy answered.  F/U in 3 weeks for anatomy scan.  

## 2012-07-28 NOTE — Progress Notes (Signed)
2nd IT today. 

## 2012-08-01 ENCOUNTER — Telehealth: Payer: Self-pay | Admitting: Advanced Practice Midwife

## 2012-08-01 LAB — MATERNAL SCREEN, INTEGRATED #2
AFP MoM: 1.91
Calculated Gestational Age: 17
Crown Rump Length: 70.4 mm
MSS Trisomy 18 Risk: <1:5000 {titer}
NT MoM: 0.95
Nuchal Translucency: 1.49 mm
PAPP-A MoM: 0.72
hCG, Serum: 15.1 IU/mL

## 2012-08-01 NOTE — Telephone Encounter (Signed)
Pt stated unsure who had called but was returning a call to our office. No office staff had called pt today. Pt has an appt scheduled will follow up for lab results at that time.

## 2012-08-18 ENCOUNTER — Ambulatory Visit (INDEPENDENT_AMBULATORY_CARE_PROVIDER_SITE_OTHER): Payer: BC Managed Care – PPO | Admitting: Advanced Practice Midwife

## 2012-08-18 ENCOUNTER — Ambulatory Visit (INDEPENDENT_AMBULATORY_CARE_PROVIDER_SITE_OTHER): Payer: BC Managed Care – PPO

## 2012-08-18 ENCOUNTER — Other Ambulatory Visit: Payer: Self-pay | Admitting: Advanced Practice Midwife

## 2012-08-18 ENCOUNTER — Encounter: Payer: Self-pay | Admitting: Advanced Practice Midwife

## 2012-08-18 VITALS — BP 100/62 | Wt 209.0 lb

## 2012-08-18 DIAGNOSIS — Z3480 Encounter for supervision of other normal pregnancy, unspecified trimester: Secondary | ICD-10-CM

## 2012-08-18 DIAGNOSIS — Z1389 Encounter for screening for other disorder: Secondary | ICD-10-CM

## 2012-08-18 DIAGNOSIS — Z331 Pregnant state, incidental: Secondary | ICD-10-CM

## 2012-08-18 DIAGNOSIS — Z363 Encounter for antenatal screening for malformations: Secondary | ICD-10-CM

## 2012-08-18 DIAGNOSIS — Z3482 Encounter for supervision of other normal pregnancy, second trimester: Secondary | ICD-10-CM

## 2012-08-18 LAB — POCT URINALYSIS DIPSTICK
Blood, UA: NEGATIVE
Ketones, UA: NEGATIVE
Nitrite, UA: NEGATIVE

## 2012-08-18 NOTE — Progress Notes (Signed)
U/S 20+2wks-active fetus, meas c/w dates, fluid wnl, ant gr 0 plac,female fetus, no major abnl noted, cx long and closed, bilateral adnexa wnl

## 2012-08-18 NOTE — Progress Notes (Signed)
Had anatomy scan today.   Pain in stretch marks.  May use creams.  Routine questions about pregnancy answered.  F/U in 4 weeks for LROB.

## 2012-08-18 NOTE — Progress Notes (Signed)
Pain in belly. 

## 2012-08-23 LAB — US OB DETAIL + 14 WK

## 2012-09-15 ENCOUNTER — Ambulatory Visit (INDEPENDENT_AMBULATORY_CARE_PROVIDER_SITE_OTHER): Payer: BC Managed Care – PPO | Admitting: Advanced Practice Midwife

## 2012-09-15 ENCOUNTER — Encounter: Payer: Self-pay | Admitting: Advanced Practice Midwife

## 2012-09-15 VITALS — BP 110/70 | Wt 212.0 lb

## 2012-09-15 DIAGNOSIS — Z331 Pregnant state, incidental: Secondary | ICD-10-CM

## 2012-09-15 DIAGNOSIS — Z1389 Encounter for screening for other disorder: Secondary | ICD-10-CM

## 2012-09-15 DIAGNOSIS — Z3480 Encounter for supervision of other normal pregnancy, unspecified trimester: Secondary | ICD-10-CM

## 2012-09-15 LAB — POCT URINALYSIS DIPSTICK: Ketones, UA: NEGATIVE

## 2012-09-15 NOTE — Progress Notes (Signed)
No c/o at this time.  Routine questions about pregnancy answered.  F/U in 3 weeks for PN2/LROB .  

## 2012-09-15 NOTE — Patient Instructions (Signed)
Nothing to eat or drink after midnight before your next appointment & plan to be here for 2 hours (for your sugar test).  

## 2012-10-06 ENCOUNTER — Ambulatory Visit (INDEPENDENT_AMBULATORY_CARE_PROVIDER_SITE_OTHER): Payer: BC Managed Care – PPO | Admitting: Obstetrics & Gynecology

## 2012-10-06 ENCOUNTER — Other Ambulatory Visit: Payer: BC Managed Care – PPO

## 2012-10-06 ENCOUNTER — Encounter: Payer: Self-pay | Admitting: Obstetrics & Gynecology

## 2012-10-06 VITALS — BP 112/58 | Wt 212.0 lb

## 2012-10-06 DIAGNOSIS — Z3480 Encounter for supervision of other normal pregnancy, unspecified trimester: Secondary | ICD-10-CM

## 2012-10-06 DIAGNOSIS — Z1389 Encounter for screening for other disorder: Secondary | ICD-10-CM

## 2012-10-06 LAB — POCT URINALYSIS DIPSTICK
Blood, UA: NEGATIVE
Ketones, UA: NEGATIVE

## 2012-10-06 LAB — CBC
MCV: 89.7 fL (ref 78.0–100.0)
Platelets: 419 10*3/uL — ABNORMAL HIGH (ref 150–400)
RBC: 3.97 MIL/uL (ref 3.87–5.11)
WBC: 12.6 10*3/uL — ABNORMAL HIGH (ref 4.0–10.5)

## 2012-10-06 NOTE — Progress Notes (Signed)
BP weight and urine results all reviewed and noted. Patient reports good fetal movement, denies any bleeding and no rupture of membranes symptoms or regular contractions. Patient is without complaints. All questions were answered.  

## 2012-10-06 NOTE — Patient Instructions (Signed)
Breastfeeding A change in hormones during your pregnancy causes growth of your breast tissue and an increase in number and size of milk ducts. The hormone prolactin allows proteins, sugars, and fats from your blood supply to make breast milk in your milk-producing glands. The hormone progesterone prevents breast milk from being released before the birth of your baby. After the birth of your baby, your progesterone level decreases allowing breast milk to be released. Thoughts of your baby, as well as his or her sucking or crying, can stimulate the release of milk from the milk-producing glands. Deciding to breastfeed (nurse) is one of the best choices you can make for you and your baby. The information that follows gives a brief review of the benefits, as well as other important skills to know about breastfeeding. BENEFITS OF BREASTFEEDING For your baby  The first milk (colostrum) helps your baby's digestive system function better.   There are antibodies in your milk that help your baby fight off infections.   Your baby has a lower incidence of asthma, allergies, and sudden infant death syndrome (SIDS).   The nutrients in breast milk are better for your baby than infant formulas.  Breast milk improves your baby's brain development.   Your baby will have less gas, colic, and constipation.  Your baby is less likely to develop other conditions, such as childhood obesity, asthma, or diabetes mellitus. For you  Breastfeeding helps develop a very special bond between you and your baby.   Breastfeeding is convenient, always available at the correct temperature, and costs nothing.   Breastfeeding helps to burn calories and helps you lose the weight gained during pregnancy.   Breastfeeding makes your uterus contract back down to normal size faster and slows bleeding following delivery.   Breastfeeding mothers have a lower risk of developing osteoporosis or breast or ovarian cancer later  in life.  BREASTFEEDING FREQUENCY  A healthy, full-term baby may breastfeed as often as every hour or space his or her feedings to every 3 hours. Breastfeeding frequency will vary from baby to baby.   Newborns should be fed no less than every 2 3 hours during the day and every 4 5 hours during the night. You should breastfeed a minimum of 8 feedings in a 24 hour period.  Awaken your baby to breastfeed if it has been 3 4 hours since the last feeding.  Breastfeed when you feel the need to reduce the fullness of your breasts or when your newborn shows signs of hunger. Signs that your baby may be hungry include:  Increased alertness or activity.  Stretching.  Movement of the head from side to side.  Movement of the head and opening of the mouth when the corner of the mouth or cheek is stroked (rooting).  Increased sucking sounds, smacking lips, cooing, sighing, or squeaking.  Hand-to-mouth movements.  Increased sucking of fingers or hands.  Fussing.  Intermittent crying.  Signs of extreme hunger will require calming and consoling before you try to feed your baby. Signs of extreme hunger may include:  Restlessness.  A loud, strong cry.  Screaming.  Frequent feeding will help you make more milk and will help prevent problems, such as sore nipples and engorgement of the breasts.  BREASTFEEDING   Whether lying down or sitting, be sure that the baby's abdomen is facing your abdomen.   Support your breast with 4 fingers under your breast and your thumb above your nipple. Make sure your fingers are well away from   your nipple and your baby's mouth.   Stroke your baby's lips gently with your finger or nipple.   When your baby's mouth is open wide enough, place all of your nipple and as much of the colored area around your nipple (areola) as possible into your baby's mouth.  More areola should be visible above his or her upper lip than below his or her lower lip.  Your  baby's tongue should be between his or her lower gum and your breast.  Ensure that your baby's mouth is correctly positioned around the nipple (latched). Your baby's lips should create a seal on your breast.  Signs that your baby has effectively latched onto your nipple include:  Tugging or sucking without pain.  Swallowing heard between sucks.  Absent click or smacking sound.  Muscle movement above and in front of his or her ears with sucking.  Your baby must suck about 2 3 minutes in order to get your milk. Allow your baby to feed on each breast as long as he or she wants. Nurse your baby until he or she unlatches or falls asleep at the first breast, then offer the second breast.  Signs that your baby is full and satisfied include:  A gradual decrease in the number of sucks or complete cessation of sucking.  Falling asleep.  Extension or relaxation of his or her body.  Retention of a small amount of milk in his or her mouth.  Letting go of your breast by himself or herself.  Signs of effective breastfeeding in you include:  Breasts that have increased firmness, weight, and size prior to feeding.  Breasts that are softer after nursing.  Increased milk volume, as well as a change in milk consistency and color by the 5th day of breastfeeding.  Breast fullness relieved by breastfeeding.  Nipples are not sore, cracked, or bleeding.  If needed, break the suction by putting your finger into the corner of your baby's mouth and sliding your finger between his or her gums. Then, remove your breast from his or her mouth.  It is common for babies to spit up a small amount after a feeding.  Babies often swallow air during feeding. This can make babies fussy. Burping your baby between breasts can help with this.  Vitamin D supplements are recommended for babies who get only breast milk.  Avoid using a pacifier during your baby's first 4 6 weeks.  Avoid supplemental feedings of  water, formula, or juice in place of breastfeeding. Breast milk is all the food your baby needs. It is not necessary for your baby to have water or formula. Your breasts will make more milk if supplemental feedings are avoided during the early weeks. HOW TO TELL WHETHER YOUR BABY IS GETTING ENOUGH BREAST MILK Wondering whether or not your baby is getting enough milk is a common concern among mothers. You can be assured that your baby is getting enough milk if:   Your baby is actively sucking and you hear swallowing.   Your baby seems relaxed and satisfied after a feeding.   Your baby nurses at least 8 12 times in a 24 hour time period.  During the first 3 5 days of age:  Your baby is wetting at least 3 5 diapers in a 24 hour period. The urine should be clear and pale yellow.  Your baby is having at least 3 4 stools in a 24 hour period. The stool should be soft and yellow.  At   5 7 days of age, your baby is having at least 3 6 stools in a 24 hour period. The stool should be seedy and yellow by 5 days of age.  Your baby has a weight loss less than 7 10% during the first 3 days of age.  Your baby does not lose weight after 3 7 days of age.  Your baby gains 4 7 ounces each week after he or she is 4 days of age.  Your baby gains weight by 5 days of age and is back to birth weight within 2 weeks. ENGORGEMENT In the first week after your baby is born, you may experience extremely full breasts (engorgement). When engorged, your breasts may feel heavy, warm, or tender to the touch. Engorgement peaks within 24 48 hours after delivery of your baby.  Engorgement may be reduced by:  Continuing to breastfeed.  Increasing the frequency of breastfeeding.  Taking warm showers or applying warm, moist heat to your breasts just before each feeding. This increases circulation and helps the milk flow.   Gently massaging your breast before and during the feedings. With your fingertips, massage from  your chest wall towards your nipple in a circular motion.   Ensuring that your baby empties at least one breast at every feeding. It also helps to start the next feeding on the opposite breast.   Expressing breast milk by hand or by using a breast pump to empty the breasts if your baby is sleepy, or not nursing well. You may also want to express milk if you are returning to work oryou feel you are getting engorged.  Ensuring your baby is latched on and positioned properly while breastfeeding. If you follow these suggestions, your engorgement should improve in 24 48 hours. If you are still experiencing difficulty, call your lactation consultant or caregiver.  CARING FOR YOURSELF Take care of your breasts.  Bathe or shower daily.   Avoid using soap on your nipples.   Wear a supportive bra. Avoid wearing underwire style bras.  Air dry your nipples for a 3 4minutes after each feeding.   Use only cotton bra pads to absorb breast milk leakage. Leaking of breast milk between feedings is normal.   Use only pure lanolin on your nipples after nursing. You do not need to wash it off before feeding your baby again. Another option is to express a few drops of breast milk and gently massage that milk into your nipples.  Continue breast self-awareness checks. Take care of yourself.  Eat healthy foods. Alternate 3 meals with 3 snacks.  Avoid foods that you notice affect your baby in a bad way.  Drink milk, fruit juice, and water to satisfy your thirst (about 8 glasses a day).   Rest often, relax, and take your prenatal vitamins to prevent fatigue, stress, and anemia.  Avoid chewing and smoking tobacco.  Avoid alcohol and drug use.  Take over-the-counter and prescribed medicine only as directed by your caregiver or pharmacist. You should always check with your caregiver or pharmacist before taking any new medicine, vitamin, or herbal supplement.  Know that pregnancy is possible while  breastfeeding. If desired, talk to your caregiver about family planning and safe birth control methods that may be used while breastfeeding. SEEK MEDICAL CARE IF:   You feel like you want to stop breastfeeding or have become frustrated with breastfeeding.  You have painful breasts or nipples.  Your nipples are cracked or bleeding.  Your breasts are red, tender,   or warm.  You have a swollen area on either breast.  You have a fever or chills.  You have nausea or vomiting.  You have drainage from your nipples.  Your breasts do not become full before feedings by the 5th day after delivery.  You feel sad and depressed.  Your baby is too sleepy to eat well.  Your baby is having trouble sleeping.   Your baby is wetting less than 3 diapers in a 24 hour period.  Your baby has less than 3 stools in a 24 hour period.  Your baby's skin or the white part of his or her eyes becomes more yellow.   Your baby is not gaining weight by 5 days of age. MAKE SURE YOU:   Understand these instructions.  Will watch your condition.  Will get help right away if you are not doing well or get worse. Document Released: 03/30/2005 Document Revised: 12/23/2011 Document Reviewed: 11/04/2011 [employer] Patient Information 2014 [employer].  

## 2012-10-06 NOTE — Progress Notes (Signed)
FOR PN2 TODAY. 

## 2012-10-07 LAB — GLUCOSE TOLERANCE, 2 HOURS W/ 1HR
Glucose, 1 hour: 171 mg/dL — ABNORMAL HIGH (ref 70–170)
Glucose, Fasting: 72 mg/dL (ref 70–99)

## 2012-10-07 LAB — HIV ANTIBODY (ROUTINE TESTING W REFLEX): HIV: NONREACTIVE

## 2012-10-07 LAB — RPR

## 2012-10-27 ENCOUNTER — Ambulatory Visit (INDEPENDENT_AMBULATORY_CARE_PROVIDER_SITE_OTHER): Payer: BC Managed Care – PPO | Admitting: Advanced Practice Midwife

## 2012-10-27 VITALS — BP 110/60 | Wt 215.5 lb

## 2012-10-27 DIAGNOSIS — Z1389 Encounter for screening for other disorder: Secondary | ICD-10-CM

## 2012-10-27 DIAGNOSIS — Z3482 Encounter for supervision of other normal pregnancy, second trimester: Secondary | ICD-10-CM

## 2012-10-27 DIAGNOSIS — O99810 Abnormal glucose complicating pregnancy: Secondary | ICD-10-CM

## 2012-10-27 DIAGNOSIS — Z331 Pregnant state, incidental: Secondary | ICD-10-CM

## 2012-10-27 DIAGNOSIS — O24410 Gestational diabetes mellitus in pregnancy, diet controlled: Secondary | ICD-10-CM | POA: Insufficient documentation

## 2012-10-27 DIAGNOSIS — O99019 Anemia complicating pregnancy, unspecified trimester: Secondary | ICD-10-CM

## 2012-10-27 LAB — POCT URINALYSIS DIPSTICK
Blood, UA: NEGATIVE
Glucose, UA: NEGATIVE

## 2012-10-27 NOTE — Progress Notes (Signed)
C/o lower abdominal pressure.  Sounds like round ligament pain.  Discussed A1 DM.  FOB is Type 1 DM.    No c/o at this time.  Routine questions about pregnancy answered.  F/U in 2 weeks for LROB/look at blood sugars.[name]

## 2012-10-27 NOTE — Patient Instructions (Addendum)
Gestational Diabetes Mellitus Gestational diabetes mellitus, often simply referred to as gestational diabetes, is a type of diabetes that some women develop during pregnancy. In gestational diabetes, the pancreas does not make enough insulin (a hormone), the cells are less responsive to the insulin that is made (insulin resistance), or both.Normally, insulin moves sugars from food into the tissue cells. The tissue cells use the sugars for energy. The lack of insulin or the lack of normal response to insulin causes excess sugars to build up in the blood instead of going into the tissue cells. As a result, high blood sugar (hyperglycemia) develops. The effect of high sugar (glucose) levels can cause many complications.  RISK FACTORS You have an increased chance of developing gestational diabetes if you have a family history of diabetes and also have one or more of the following risk factors:  A body mass index over 30 (obesity).  A previous pregnancy with gestational diabetes.  An older age at the time of pregnancy. If blood glucose levels are kept in the normal range during pregnancy, women can have a healthy pregnancy. If your blood glucose levels are not well controlled, there may be risks to you, your unborn baby (fetus), your labor and delivery, or your newborn baby.  SYMPTOMS  If symptoms are experienced, they are much like symptoms you would normally expect during pregnancy. The symptoms of gestational diabetes include:   Increased thirst (polydipsia).  Increased urination (polyuria).  Increased urination during the night (nocturia).  Weight loss. This weight loss may be rapid.  Frequent, recurring infections.  Tiredness (fatigue).  Weakness.  Vision changes, such as blurred vision.  Fruity smell to your breath.  Abdominal pain. DIAGNOSIS Diabetes is diagnosed when blood glucose levels are increased. Your blood glucose level may be checked by one or more of the following blood  tests:  A fasting blood glucose test. You will not be allowed to eat for at least 8 hours before a blood sample is taken.  A random blood glucose test. Your blood glucose is checked at any time of the day regardless of when you ate.  A hemoglobin A1c blood glucose test. A hemoglobin A1c test provides information about blood glucose control over the previous 3 months.  An oral glucose tolerance test (OGTT). Your blood glucose is measured after you have not eaten (fasted) for 1 3 hours and then after you drink a glucose-containing beverage. Since the hormones that cause insulin resistance are highest at about 24 28 weeks of a pregnancy, an OGTT is usually performed during that time. If you have risk factors for gestational diabetes, your caregiver may test you for gestational diabetes earlier than 24 weeks of pregnancy. TREATMENT   You will need to take diabetes medicine or insulin daily to keep blood glucose levels in the desired range.  You will need to match insulin dosing with exercise and healthy food choices. The treatment goal is to maintain the before meal (preprandial), bedtime, and overnight blood glucose level at 60 99 mg/dL during pregnancy. The treatment goal is to further maintain peak after meal blood sugar (postprandial glucose) level at 100 140 mg/dL.  HOME CARE INSTRUCTIONS   Have your hemoglobin A1c level checked twice a year.  Perform daily blood glucose monitoring as directed by your caregiver. It is common to perform frequent blood glucose monitoring.  Monitor urine ketones when you are ill and as directed by your caregiver.  Take your diabetes medicine and insulin as directed by your caregiver to  maintain your blood glucose level in the desired range.  Never run out of diabetes medicine or insulin. It is needed every day.  Adjust insulin based on your intake of carbohydrates. Carbohydrates can raise blood glucose levels but need to be included in your diet.  Carbohydrates provide vitamins, minerals, and fiber which are an essential part of a healthy diet. Carbohydrates are found in fruits, vegetables, whole grains, dairy products, legumes, and foods containing added sugars.    Eat healthy foods. Alternate 3 meals with 3 snacks.  Maintain a healthy weight gain. The usual total expected weight gain varies according to your prepregnancy body mass index (BMI).  Carry a medical alert card or wear your medical alert jewelry.  Carry a 15 gram carbohydrate snack with you at all times to treat low blood glucose (hypoglycemia). Some examples of 15 gram carbohydrate snacks include:  Glucose tablets, 3 or 4   Glucose gel, 15 gram tube  Raisins, 2 tablespoons (24 g)  Jelly beans, 6  Animal crackers, 8  Fruit juice, regular soda, or low fat milk, 4 ounces (120 mL)  Gummy treats, 9    Recognize hypoglycemia. Hypoglycemia during pregnancy occurs with blood glucose levels of 60 mg/dL and below. The risk for hypoglycemia increases when fasting or skipping meals, during or after intense exercise, and during sleep. Hypoglycemia symptoms can include:  Tremors or shakes.  Decreased ability to concentrate.  Sweating.  Increased heart rate.  Headache.  Dry mouth.  Hunger.  Irritability.  Anxiety.  Restless sleep.  Altered speech or coordination.  Confusion.  Treat hypoglycemia promptly. If you are alert and able to safely swallow, follow the 15:15 rule:  Take 15 20 grams of rapid-acting glucose or carbohydrate. Rapid-acting options include glucose gel, glucose tablets, or 4 ounces (120 mL) of fruit juice, regular soda, or low fat milk.  Check your blood glucose level 15 minutes after taking the glucose.   Take 15 20 grams more of glucose if the repeat blood glucose level is still 70 mg/dL or below.  Eat a meal or snack within 1 hour once blood glucose levels return to normal.  Be alert to polyuria and polydipsia which are early  signs of hyperglycemia. An early awareness of hyperglycemia allows for prompt treatment. Treat hyperglycemia as directed by your caregiver.  Engage in at least 30 minutes of physical activity a day or as directed by your caregiver. Ten minutes of physical activity timed 30 minutes after each meal is encouraged to control postprandial blood glucose levels.  Adjust your insulin dosing and food intake as needed if you start a new exercise or sport.  Follow your sick day plan at any time you are unable to eat or drink as usual.  Avoid tobacco and alcohol use.  Follow up with your caregiver regularly.  Follow the advice of your caregiver regarding your prenatal and post-delivery (postpartum) appointments, meal planning, exercise, medicines, vitamins, blood tests, other medical tests, and physical activities.  Perform daily skin and foot care. Examine your skin and feet daily for cuts, bruises, redness, nail problems, bleeding, blisters, or sores.  Brush your teeth and gums at least twice a day and floss at least once a day. Follow up with your dentist regularly.  Schedule an eye exam during the first trimester of your pregnancy or as directed by your caregiver.  Share your diabetes management plan with your workplace or school.  Stay up-to-date with immunizations.  Learn to manage stress.  Obtain ongoing diabetes education   and support as needed. SEEK MEDICAL CARE IF:   You are unable to eat food or drink fluids for more than 6 hours.  You have nausea and vomiting for more than 6 hours.  You have a blood glucose level of 200 mg/dL and you have ketones in your urine.  There is a change in mental status.  You develop vision problems.  You have a persistent headache.  You have upper abdominal pain or discomfort.  You develop an additional serious illness.  You have diarrhea for more than 6 hours.  You have been sick or have had a fever for a couple of days and are not getting  better. SEEK IMMEDIATE MEDICAL CARE IF:   You have difficulty breathing.  You no longer feel the baby moving.  You are bleeding or have discharge from your vagina.  You start having premature contractions or labor. MAKE SURE YOU:  Understand these instructions.  Will watch your condition.  Will get help right away if you are not doing well or get worse. Document Released: 07/06/2000 Document Revised: 12/23/2011 Document Reviewed: 10/27/2011 [facility] Patient Information 2014 [place], [place].   You will get a call in the next few days from the Nutritionist to schedule an appointment.  You will be taught how to check your blood sugar and be given a meter at this visit.  Please let us know if you do not get this scheduled!

## 2012-11-02 ENCOUNTER — Other Ambulatory Visit: Payer: BC Managed Care – PPO

## 2012-11-07 ENCOUNTER — Ambulatory Visit (INDEPENDENT_AMBULATORY_CARE_PROVIDER_SITE_OTHER): Payer: BC Managed Care – PPO

## 2012-11-07 ENCOUNTER — Ambulatory Visit (INDEPENDENT_AMBULATORY_CARE_PROVIDER_SITE_OTHER): Payer: BC Managed Care – PPO | Admitting: Obstetrics and Gynecology

## 2012-11-07 ENCOUNTER — Other Ambulatory Visit: Payer: Self-pay | Admitting: Advanced Practice Midwife

## 2012-11-07 VITALS — BP 118/60 | Wt 216.8 lb

## 2012-11-07 DIAGNOSIS — Z3482 Encounter for supervision of other normal pregnancy, second trimester: Secondary | ICD-10-CM

## 2012-11-07 DIAGNOSIS — O99810 Abnormal glucose complicating pregnancy: Secondary | ICD-10-CM

## 2012-11-07 DIAGNOSIS — O99019 Anemia complicating pregnancy, unspecified trimester: Secondary | ICD-10-CM

## 2012-11-07 DIAGNOSIS — Z1389 Encounter for screening for other disorder: Secondary | ICD-10-CM

## 2012-11-07 DIAGNOSIS — Z331 Pregnant state, incidental: Secondary | ICD-10-CM

## 2012-11-07 DIAGNOSIS — O24419 Gestational diabetes mellitus in pregnancy, unspecified control: Secondary | ICD-10-CM

## 2012-11-07 LAB — POCT URINALYSIS DIPSTICK
Blood, UA: NEGATIVE
Protein, UA: NEGATIVE

## 2012-11-07 NOTE — Progress Notes (Signed)
F/U  S>D, meas c/w dates, 4#, 14 oz., /70% for 32 wks, AFI= 17.8 cm/65%, active female , ant. Plac. Gr #2, vertex lie , FHT 148/bpm

## 2012-11-07 NOTE — Progress Notes (Signed)
C/o lower abdominal pain and pressure.

## 2012-11-07 NOTE — Progress Notes (Signed)
U/S efw 70 th percentile. Pt to get glucometer and diabetic classes this wk. F/u 1wk to review cbg's. jvf

## 2012-11-07 NOTE — Progress Notes (Signed)
FOLLOW UP SONOGRAM   [name] is in the office for a follow up sonogram for S>D.  She is a 27 y.o. year old G2P1001 with Estimated Date of Delivery: 01/03/13 by early ultrasound now at  [redacted]w[redacted]d weeks gestation. Thus far the pregnancy has been complicated by GDM.  GESTATION: SINGLETON  PRESENTATION: cephalic  FETAL ACTIVITY:          Heart rate         148/bpm          The fetus is active.  AMNIOTIC FLUID: The amniotic fluid volume is  normal, 17.8 cm./65%  PLACENTA LOCALIZATION:  anterior GRADE 1  CERVIX: not seen  ADNEXA: The ovaries are normal.   GESTATIONAL AGE AND  BIOMETRICS:  Gestational criteria: Estimated Date of Delivery: 01/03/13 by early ultrasound now at [redacted]w[redacted]d  Previous Scans:2  GESTATIONAL SAC            mm          weeks  CROWN RUMP LENGTH            mm          weeks  NUCHAL TRANSLUCENCY            mm         normal  BIPARIETAL DIAMETER           8.3 cm         33+3 weeks  HEAD CIRCUMFERENCE           29.2 cm         32+2 weeks  ABDOMINAL CIRCUMFERENCE           29.2 cm         33+2 weeks  FEMUR LENGTH           6.4 cm         33+2 weeks                                                           AVERAGE EGA(BY THIS SCAN):  33+0 weeks                                                 ESTIMATED FETAL WEIGHT:       2200  grams, 70 % ANATOMICAL SURVEY                                                                            COMMENTS CEREBRAL VENTRICLES yes normal   CHOROID PLEXUS  yes normal   CEREBELLUM yes normal   CISTERNA MAGNA yes normal   NUCHAL REGION yes normal   ORBITS yes normal   NASAL BONE yes normal   NOSE/LIP yes normal   FACIAL PROFILE yes normal   4 CHAMBERED HEART yes normal   OUTFLOW TRACTS yes normal   DIAPHRAGM yes normal   STOMACH yes normal  RENAL REGION yes normal   BLADDER yes normal   CORD INSERTION no    3 VESSEL CORD yes normal   SPINE yes normal   ARMS/HANDS yes normal   LEGS/FEET yes normal   GENITALIA yes  normal female        SUSPECTED ABNORMALITIES:  no  QUALITY OF SCAN: satisfactory  TECHNICIAN COMMENTS:         A copy of this report including all images has been saved and backed up to a second source for retrieval if needed. All measures and details of the anatomical scan, placentation, fluid volume and pelvic anatomy are contained in that report.  Reggy Eye 11/07/2012 3:48 PM

## 2012-11-07 NOTE — Patient Instructions (Signed)
Please complete class this wk.

## 2012-11-09 ENCOUNTER — Encounter: Payer: Medicaid Other | Attending: Advanced Practice Midwife

## 2012-11-09 VITALS — Ht 61.0 in | Wt 217.0 lb

## 2012-11-09 DIAGNOSIS — O99810 Abnormal glucose complicating pregnancy: Secondary | ICD-10-CM | POA: Insufficient documentation

## 2012-11-09 DIAGNOSIS — Z713 Dietary counseling and surveillance: Secondary | ICD-10-CM | POA: Insufficient documentation

## 2012-11-09 NOTE — Progress Notes (Signed)
  Patient was seen on 11/09/12 for Gestational Diabetes self-management class at [facility]. The following learning objectives were met by the patient during this course:   States the definition of Gestational Diabetes  States why dietary management is important in controlling blood glucose  Describes the effects each nutrient has on blood glucose levels  Demonstrates ability to create a balanced meal plan  Demonstrates carbohydrate counting   States when to check blood glucose levels  Demonstrates proper blood glucose monitoring techniques  States the effect of stress and exercise on blood glucose levels  States the importance of limiting caffeine and abstaining from alcohol and smoking  Blood glucose monitor given: Contour Next EZ Lot # [id number] Exp: 05-2013 Blood glucose reading: 76  Patient instructed to monitor glucose levels: FBS: 60 - <90 1 hour: <140 2 hour: <120  *Patient received handouts:  Nutrition Diabetes and Pregnancy  Carbohydrate Counting List  Patient will be seen for follow-up as needed.

## 2012-11-09 NOTE — Patient Instructions (Signed)
Goals:  Check glucose levels per MD as instructed  Follow Gestational Diabetes Diet as instructed  Call for follow-up as needed    

## 2012-11-11 ENCOUNTER — Telehealth: Payer: Self-pay | Admitting: *Deleted

## 2012-11-11 NOTE — Telephone Encounter (Signed)
Pt requesting lancet and stripes for contour next, checks bs x 4 times. Lancets and stripes called to [facility], per verbal order by Cyril Mourning, NP.

## 2012-11-14 ENCOUNTER — Ambulatory Visit (INDEPENDENT_AMBULATORY_CARE_PROVIDER_SITE_OTHER): Payer: Medicaid Other | Admitting: Obstetrics and Gynecology

## 2012-11-14 VITALS — BP 104/60 | Wt 214.4 lb

## 2012-11-14 DIAGNOSIS — O24410 Gestational diabetes mellitus in pregnancy, diet controlled: Secondary | ICD-10-CM

## 2012-11-14 DIAGNOSIS — O99019 Anemia complicating pregnancy, unspecified trimester: Secondary | ICD-10-CM

## 2012-11-14 DIAGNOSIS — O99810 Abnormal glucose complicating pregnancy: Secondary | ICD-10-CM

## 2012-11-14 DIAGNOSIS — Z1389 Encounter for screening for other disorder: Secondary | ICD-10-CM

## 2012-11-14 DIAGNOSIS — Z331 Pregnant state, incidental: Secondary | ICD-10-CM

## 2012-11-14 LAB — POCT URINALYSIS DIPSTICK
Blood, UA: NEGATIVE
Ketones, UA: NEGATIVE
Protein, UA: NEGATIVE

## 2012-11-14 MED ORDER — GLUCOSE BLOOD VI STRP: ORAL_STRIP | Status: DC

## 2012-11-14 MED ORDER — ONETOUCH ULTRA SYSTEM W/DEVICE KIT: 1.0000 | PACK | Freq: Once | Status: DC

## 2012-11-14 NOTE — Progress Notes (Signed)
No complaints at this time. Pt requesting RX for glucometer (one touch), lancets, and strips.  Fh= U+16 DMA-1:  CBG's fasting 65-80, 2hr pc's90-159. Rarely above 130. A stable for now on diet control.

## 2012-11-14 NOTE — Patient Instructions (Signed)
Doing great on blood glucose control.

## 2012-11-21 ENCOUNTER — Inpatient Hospital Stay (HOSPITAL_COMMUNITY)
Admission: AD | Admit: 2012-11-21 | Discharge: 2012-11-21 | Disposition: A | Discharge status: Home / Self Care | Payer: Medicaid Other | Source: Ambulatory Visit | Attending: Obstetrics and Gynecology | Admitting: Obstetrics and Gynecology

## 2012-11-21 ENCOUNTER — Encounter (HOSPITAL_COMMUNITY): Payer: Self-pay | Admitting: *Deleted

## 2012-11-21 DIAGNOSIS — O99891 Other specified diseases and conditions complicating pregnancy: Secondary | ICD-10-CM | POA: Insufficient documentation

## 2012-11-21 DIAGNOSIS — M62830 Muscle spasm of back: Secondary | ICD-10-CM

## 2012-11-21 DIAGNOSIS — O99810 Abnormal glucose complicating pregnancy: Secondary | ICD-10-CM | POA: Insufficient documentation

## 2012-11-21 DIAGNOSIS — M549 Dorsalgia, unspecified: Secondary | ICD-10-CM | POA: Insufficient documentation

## 2012-11-21 DIAGNOSIS — M5380 Other specified dorsopathies, site unspecified: Secondary | ICD-10-CM

## 2012-11-21 LAB — URINALYSIS, ROUTINE W REFLEX MICROSCOPIC
Bilirubin Urine: NEGATIVE
Glucose, UA: NEGATIVE mg/dL
Ketones, ur: NEGATIVE mg/dL
pH: 7 (ref 5.0–8.0)

## 2012-11-21 LAB — URINE MICROSCOPIC-ADD ON

## 2012-11-21 NOTE — MAU Note (Signed)
Pt had mid back pain that woke her up and radiated to abdomen, lasted for 45 min.  Pain has eased off at this time. Denies bleeding.   Gestational diabetes, diet controlled.

## 2012-11-21 NOTE — MAU Provider Note (Signed)
History     CSN: [id number]  Arrival date and time: 11/21/12 0119   None     Chief Complaint  Patient presents with  . Back Pain  . Abdominal Pain   Back Pain Pertinent negatives include no abdominal pain, chest pain, dysuria, fever or headaches.  Abdominal Pain Pertinent negatives include no dysuria, fever, frequency, headaches, hematuria, nausea or vomiting.   [name] is a 27 y.o. G2P1001 at [redacted]w[redacted]d presents to MAU for triage of acute back pain. Patient will midnight tonight with severe mid back pain that was located in her midback and radiated bilaterally. Patient has not had similar pains in the past. Patient felt mildly short of breath the symptoms. She described as 10 out of 10 pain at 45 minutes resolved spontaneously. Patient otherwise been doing well.  Patient endorses fetal movement, no loss of fluid, no vaginal bleeding, no contractions   OB History   Grav Para Term Preterm Abortions TAB SAB Ect Mult Living   2 1 1       1       Past Medical History  Diagnosis Date  . Medical history non-contributory   . Diabetes mellitus without complication     Past Surgical History  Procedure Laterality Date  . Orthopedic surgery      Family History  Problem Relation Age of Onset  . Hypertension Father   . Cancer Maternal Uncle     lung  . Diabetes Maternal Grandmother   . Diabetes Maternal Grandfather   . Cancer Maternal Grandfather     lung    History  Substance Use Topics  . Smoking status: Never Smoker   . Smokeless tobacco: Never Used  . Alcohol Use: Yes     Comment: OCC; none since +preg. test.    Allergies: No Known Allergies  Prescriptions prior to admission  Medication Sig Dispense Refill  . Blood Glucose Monitoring Suppl (ONE TOUCH ULTRA SYSTEM KIT) W/DEVICE KIT 1 kit by Does not apply route once.  1 each  0  . glucose blood test strip Use as instructed  100 each  12  . PRENATAL VITAMINS PO Take 1 tablet by mouth.        Review of  Systems  Constitutional: Negative for fever and chills.  Eyes: Negative for blurred vision.  Respiratory: Negative for cough.   Cardiovascular: Negative for chest pain.  Gastrointestinal: Negative for heartburn, nausea, vomiting and abdominal pain.  Genitourinary: Negative for dysuria, urgency, frequency, hematuria and flank pain.  Musculoskeletal: Positive for back pain.  Neurological: Negative for headaches.   Physical Exam   Blood pressure 118/66, pulse 96, temperature 98.4 F (36.9 C), temperature source Oral, resp. rate 18, height 5\' 1"  (1.549 m), weight 99.156 kg (218 lb 9.6 oz), last menstrual period 03/29/2012.  Physical Exam  Constitutional: She is oriented to person, place, and time. She appears well-developed and well-nourished. No distress.  HENT:  Head: Normocephalic and atraumatic.  Eyes: Conjunctivae and EOM are normal. Right eye exhibits no discharge. Left eye exhibits no discharge. No scleral icterus.  Neck: Normal range of motion. Neck supple.  Cardiovascular: Normal rate, regular rhythm, normal heart sounds and intact distal pulses.  Exam reveals no gallop and no friction rub.   No murmur heard. Respiratory: Effort normal and breath sounds normal. No respiratory distress. She has no wheezes. She has no rales. She exhibits no tenderness.  GI: Soft. Bowel sounds are normal. She exhibits no distension and no mass. There is no  tenderness. There is no rebound and no guarding.  Musculoskeletal: Normal range of motion. She exhibits no edema and no tenderness.  Neurological: She is alert and oriented to person, place, and time.  Skin: Skin is warm and dry. No rash noted. She is not diaphoretic.   no midline tenderness on back. Unable to reproduce pain.   FHT: 145s, mod variability, mult accels 15x15 no decels.  Toco: no ctx MAU Course  Procedures  MDM Patient's physical exam and story consistent with back spasm that has since resolved. Also considered possible zoster,  indigestion, pancreatitis. However unlikely given benign physical exam and tolerating by mouth. Patient is a reassuring strip at this time and normal vitals. Patient was reassured and discharged  Assessment and Plan  [name] is a 27 y.o. G2P1001 at [redacted]w[redacted]d presents to triage for evaluation and sure fetus is okay. Patient symptoms most consistent with back spasm. Unlikely alternative etiology given benign exam and vitals. Patient reassured and discharged home.   Jolyn Lent, RYAN 11/21/2012, 2:16 AM

## 2012-11-22 LAB — URINE CULTURE: Colony Count: 30000

## 2012-11-29 ENCOUNTER — Ambulatory Visit (INDEPENDENT_AMBULATORY_CARE_PROVIDER_SITE_OTHER): Payer: Medicaid Other | Admitting: Women's Health

## 2012-11-29 ENCOUNTER — Encounter: Payer: Self-pay | Admitting: Women's Health

## 2012-11-29 VITALS — BP 100/58 | Wt 217.5 lb

## 2012-11-29 DIAGNOSIS — O0993 Supervision of high risk pregnancy, unspecified, third trimester: Secondary | ICD-10-CM

## 2012-11-29 DIAGNOSIS — O99019 Anemia complicating pregnancy, unspecified trimester: Secondary | ICD-10-CM

## 2012-11-29 DIAGNOSIS — Z1389 Encounter for screening for other disorder: Secondary | ICD-10-CM

## 2012-11-29 DIAGNOSIS — O99810 Abnormal glucose complicating pregnancy: Secondary | ICD-10-CM

## 2012-11-29 DIAGNOSIS — Z3483 Encounter for supervision of other normal pregnancy, third trimester: Secondary | ICD-10-CM

## 2012-11-29 DIAGNOSIS — Z331 Pregnant state, incidental: Secondary | ICD-10-CM

## 2012-11-29 DIAGNOSIS — O1213 Gestational proteinuria, third trimester: Secondary | ICD-10-CM

## 2012-11-29 DIAGNOSIS — O24410 Gestational diabetes mellitus in pregnancy, diet controlled: Secondary | ICD-10-CM

## 2012-11-29 LAB — POCT URINALYSIS DIPSTICK
Blood, UA: NEGATIVE
Ketones, UA: NEGATIVE

## 2012-11-29 NOTE — MAU Provider Note (Signed)
Attestation of Attending Supervision of Advanced Practitioner: Evaluation and management procedures were performed by the PA/NP/CNM/OB Fellow under my supervision/collaboration. Chart reviewed and agree with management and plan.  Vencil Basnett V 11/29/2012 11:10 PM

## 2012-11-29 NOTE — Progress Notes (Signed)
Reports good fm. Denies uc's, lof, vb, urinary frequency, urgency, hesitancy, or dysuria.  FBS 60s-100 (4>90), 2hr PP 90s-130s (11>120)- reviewed w/ LHE, no need for meds at this time. Pt states she is doing well w/ diet and trying to exercise. Reviewed ptl s/s, FKC.  All questions answered. F/U in 1wk  for visit.

## 2012-11-29 NOTE — Patient Instructions (Addendum)

## 2012-12-06 ENCOUNTER — Ambulatory Visit (INDEPENDENT_AMBULATORY_CARE_PROVIDER_SITE_OTHER): Payer: Medicaid Other | Admitting: Advanced Practice Midwife

## 2012-12-06 VITALS — BP 112/60 | Wt 218.0 lb

## 2012-12-06 DIAGNOSIS — Z1389 Encounter for screening for other disorder: Secondary | ICD-10-CM

## 2012-12-06 DIAGNOSIS — Z331 Pregnant state, incidental: Secondary | ICD-10-CM

## 2012-12-06 DIAGNOSIS — E119 Type 2 diabetes mellitus without complications: Secondary | ICD-10-CM

## 2012-12-06 DIAGNOSIS — O99019 Anemia complicating pregnancy, unspecified trimester: Secondary | ICD-10-CM

## 2012-12-06 DIAGNOSIS — O99810 Abnormal glucose complicating pregnancy: Secondary | ICD-10-CM

## 2012-12-06 LAB — POCT URINALYSIS DIPSTICK
Glucose, UA: NEGATIVE
Ketones, UA: NEGATIVE

## 2012-12-06 NOTE — Progress Notes (Signed)
No complaints at this time.

## 2012-12-06 NOTE — Patient Instructions (Addendum)
Fetal Movement Counts Patient Name: __________________________________________________ Patient Due Date: ____________________ Performing a fetal movement count is highly recommended in high-risk pregnancies, but it is good for every pregnant woman to do. Your caregiver may ask you to start counting fetal movements at 28 weeks of the pregnancy. Fetal movements often increase:  After eating a full meal.  After physical activity.  After eating or drinking something sweet or cold.  At rest. Pay attention to when you feel the baby is most active. This will help you notice a pattern of your baby's sleep and wake cycles and what factors contribute to an increase in fetal movement. It is important to perform a fetal movement count at the same time each day when your baby is normally most active.  HOW TO COUNT FETAL MOVEMENTS 1. Find a quiet and comfortable area to sit or lie down on your left side. Lying on your left side provides the best blood and oxygen circulation to your baby. 2. Write down the day and time on a sheet of paper or in a journal. 3. Start counting kicks, flutters, swishes, rolls, or jabs in a 2 hour period. You should feel at least 10 movements within 2 hours. 4. If you do not feel 10 movements in 2 hours, wait 2 3 hours and count again. Look for a change in the pattern or not enough counts in 2 hours. SEEK MEDICAL CARE IF:  You feel less than 10 counts in 2 hours, tried twice.  There is no movement in over an hour.  The pattern is changing or taking longer each day to reach 10 counts in 2 hours.  You feel the baby is not moving as he or she usually does. Date: ____________ Movements: ____________ Start time: ____________ Finish time: ____________  Date: ____________ Movements: ____________ Start time: ____________ Finish time: ____________ Date: ____________ Movements: ____________ Start time: ____________ Finish time: ____________ Date: ____________ Movements: ____________  Start time: ____________ Finish time: ____________ Date: ____________ Movements: ____________ Start time: ____________ Finish time: ____________ Date: ____________ Movements: ____________ Start time: ____________ Finish time: ____________ Date: ____________ Movements: ____________ Start time: ____________ Finish time: ____________ Date: ____________ Movements: ____________ Start time: ____________ Finish time: ____________  Date: ____________ Movements: ____________ Start time: ____________ Finish time: ____________ Date: ____________ Movements: ____________ Start time: ____________ Finish time: ____________ Date: ____________ Movements: ____________ Start time: ____________ Finish time: ____________ Date: ____________ Movements: ____________ Start time: ____________ Finish time: ____________ Date: ____________ Movements: ____________ Start time: ____________ Finish time: ____________ Date: ____________ Movements: ____________ Start time: ____________ Finish time: ____________ Date: ____________ Movements: ____________ Start time: ____________ Finish time: ____________  Date: ____________ Movements: ____________ Start time: ____________ Finish time: ____________ Date: ____________ Movements: ____________ Start time: ____________ Finish time: ____________ Date: ____________ Movements: ____________ Start time: ____________ Finish time: ____________ Date: ____________ Movements: ____________ Start time: ____________ Finish time: ____________ Date: ____________ Movements: ____________ Start time: ____________ Finish time: ____________ Date: ____________ Movements: ____________ Start time: ____________ Finish time: ____________ Date: ____________ Movements: ____________ Start time: ____________ Finish time: ____________  Date: ____________ Movements: ____________ Start time: ____________ Finish time: ____________ Date: ____________ Movements: ____________ Start time: ____________ Finish time:  ____________ Date: ____________ Movements: ____________ Start time: ____________ Finish time: ____________ Date: ____________ Movements: ____________ Start time: ____________ Finish time: ____________ Date: ____________ Movements: ____________ Start time: ____________ Finish time: ____________ Date: ____________ Movements: ____________ Start time: ____________ Finish time: ____________ Date: ____________ Movements: ____________ Start time: ____________ Finish time: ____________  Date: ____________ Movements: ____________ Start time: ____________ Finish   time: ____________ Date: ____________ Movements: ____________ Start time: ____________ Finish time: ____________ Date: ____________ Movements: ____________ Start time: ____________ Finish time: ____________ Date: ____________ Movements: ____________ Start time: ____________ Finish time: ____________ Date: ____________ Movements: ____________ Start time: ____________ Finish time: ____________ Date: ____________ Movements: ____________ Start time: ____________ Finish time: ____________ Date: ____________ Movements: ____________ Start time: ____________ Finish time: ____________  Date: ____________ Movements: ____________ Start time: ____________ Finish time: ____________ Date: ____________ Movements: ____________ Start time: ____________ Finish time: ____________ Date: ____________ Movements: ____________ Start time: ____________ Finish time: ____________ Date: ____________ Movements: ____________ Start time: ____________ Finish time: ____________ Date: ____________ Movements: ____________ Start time: ____________ Finish time: ____________ Date: ____________ Movements: ____________ Start time: ____________ Finish time: ____________ Date: ____________ Movements: ____________ Start time: ____________ Finish time: ____________  Date: ____________ Movements: ____________ Start time: ____________ Finish time: ____________ Date: ____________ Movements:  ____________ Start time: ____________ Finish time: ____________ Date: ____________ Movements: ____________ Start time: ____________ Finish time: ____________ Date: ____________ Movements: ____________ Start time: ____________ Finish time: ____________ Date: ____________ Movements: ____________ Start time: ____________ Finish time: ____________ Date: ____________ Movements: ____________ Start time: ____________ Finish time: ____________ Date: ____________ Movements: ____________ Start time: ____________ Finish time: ____________  Date: ____________ Movements: ____________ Start time: ____________ Finish time: ____________ Date: ____________ Movements: ____________ Start time: ____________ Finish time: ____________ Date: ____________ Movements: ____________ Start time: ____________ Finish time: ____________ Date: ____________ Movements: ____________ Start time: ____________ Finish time: ____________ Date: ____________ Movements: ____________ Start time: ____________ Finish time: ____________ Date: ____________ Movements: ____________ Start time: ____________ Finish time: ____________ Document Released: 04/29/2006 Document Revised: 03/16/2012 Document Reviewed: 01/25/2012 [employer] Patient Information 2014 [employer].  

## 2012-12-06 NOTE — Progress Notes (Signed)
All but 2 FBS <95, and all but 2 PP < 120.

## 2012-12-12 ENCOUNTER — Observation Stay (HOSPITAL_COMMUNITY)
Admission: AD | Admit: 2012-12-12 | Discharge: 2012-12-13 | Disposition: A | Discharge status: Home / Self Care | Payer: Medicaid Other | Source: Ambulatory Visit | Attending: Obstetrics & Gynecology | Admitting: Obstetrics & Gynecology

## 2012-12-12 ENCOUNTER — Encounter (HOSPITAL_COMMUNITY): Payer: Self-pay

## 2012-12-12 ENCOUNTER — Other Ambulatory Visit: Payer: Self-pay

## 2012-12-12 DIAGNOSIS — O9989 Other specified diseases and conditions complicating pregnancy, childbirth and the puerperium: Secondary | ICD-10-CM

## 2012-12-12 DIAGNOSIS — O24410 Gestational diabetes mellitus in pregnancy, diet controlled: Secondary | ICD-10-CM

## 2012-12-12 DIAGNOSIS — B9789 Other viral agents as the cause of diseases classified elsewhere: Secondary | ICD-10-CM | POA: Insufficient documentation

## 2012-12-12 DIAGNOSIS — R509 Fever, unspecified: Secondary | ICD-10-CM

## 2012-12-12 DIAGNOSIS — O99810 Abnormal glucose complicating pregnancy: Secondary | ICD-10-CM | POA: Insufficient documentation

## 2012-12-12 DIAGNOSIS — O99891 Other specified diseases and conditions complicating pregnancy: Principal | ICD-10-CM | POA: Insufficient documentation

## 2012-12-12 LAB — URINALYSIS, ROUTINE W REFLEX MICROSCOPIC
Ketones, ur: >80 mg/dL — AB
Leukocytes, UA: NEGATIVE
Nitrite: NEGATIVE
Specific Gravity, Urine: 1.02 (ref 1.005–1.030)
pH: 6.5 (ref 5.0–8.0)

## 2012-12-12 LAB — CBC
Hemoglobin: 12 g/dL (ref 12.0–15.0)
Hemoglobin: 10.7 g/dL — ABNORMAL LOW (ref 12.0–15.0)
MCH: 29.6 pg (ref 26.0–34.0)
MCHC: 33.8 g/dL (ref 30.0–36.0)
RBC: 4.01 MIL/uL (ref 3.87–5.11)
RBC: 3.62 MIL/uL — ABNORMAL LOW (ref 3.87–5.11)
WBC: 14.3 10*3/uL — ABNORMAL HIGH (ref 4.0–10.5)

## 2012-12-12 LAB — CREATININE, SERUM
Creatinine, Ser: 0.65 mg/dL (ref 0.50–1.10)
GFR calc non Af Amer: >90 mL/min (ref >90)

## 2012-12-12 LAB — GLUCOSE, CAPILLARY
Glucose-Capillary: 97 mg/dL (ref 70–99)
Glucose-Capillary: 106 mg/dL — ABNORMAL HIGH (ref 70–99)

## 2012-12-12 LAB — TYPE AND SCREEN: ABO/RH(D): A POS

## 2012-12-12 MED ORDER — DOCUSATE SODIUM 100 MG PO CAPS
100.0000 mg | ORAL_CAPSULE | Freq: Every day | ORAL | Status: DC
  Filled 2012-12-12: qty 1

## 2012-12-12 MED ORDER — ENOXAPARIN SODIUM 40 MG/0.4ML ~~LOC~~ SOLN
40.0000 mg | SUBCUTANEOUS | Status: DC
  Filled 2012-12-12: qty 0.4

## 2012-12-12 MED ORDER — ZOLPIDEM TARTRATE 5 MG PO TABS: 5.0000 mg | ORAL_TABLET | Freq: Every evening | ORAL | Status: DC | PRN

## 2012-12-12 MED ORDER — ACETAMINOPHEN 500 MG PO TABS
1000.0000 mg | ORAL_TABLET | Freq: Once | ORAL | Status: AC
  Administered 2012-12-12: 1000 mg via ORAL
  Filled 2012-12-12: qty 2

## 2012-12-12 MED ORDER — PRENATAL MULTIVITAMIN CH
1.0000 | ORAL_TABLET | Freq: Every day | ORAL | Status: DC
  Filled 2012-12-12: qty 1

## 2012-12-12 MED ORDER — CALCIUM CARBONATE ANTACID 500 MG PO CHEW
2.0000 | CHEWABLE_TABLET | ORAL | Status: DC | PRN
  Filled 2012-12-12: qty 2

## 2012-12-12 MED ORDER — ACETAMINOPHEN 325 MG PO TABS: 650.0000 mg | ORAL_TABLET | ORAL | Status: DC | PRN

## 2012-12-12 MED ORDER — LACTATED RINGERS IV BOLUS (SEPSIS)
1000.0000 mL | Freq: Once | INTRAVENOUS | Status: AC
  Administered 2012-12-12: 1000 mL via INTRAVENOUS

## 2012-12-12 NOTE — H&P (Cosign Needed)
[name] is a 26 y.o. female at [redacted]w[redacted]d with GDMA1 presenting for febrile illness at home. Pt reports that she took her temp and it was 101 at home. Pt felt warm but otherwise was doing well. Pt's daughter had a fever yesterday that resolved today. Pt endorses mild headache, denies vision change, cp, sob, cough, nausea, vomiting, diarrhea, constipation. Denies urinary symptoms, back pain, joint pains, or any other source at this time.  Marland Kitchen History OB History   Grav Para Term Preterm Abortions TAB SAB Ect Mult Living   2 1 1       1      Past Medical History  Diagnosis Date  . Medical history non-contributory   . Diabetes mellitus without complication    Past Surgical History  Procedure Laterality Date  . Orthopedic surgery     Family History: family history includes Cancer in her maternal grandfather and maternal uncle; Diabetes in her maternal grandfather and maternal grandmother; Hypertension in her father. Social History:  reports that she has never smoked. She has never used smokeless tobacco. She reports that  drinks alcohol. She reports that she does not use illicit drugs.  ROS As above   Blood pressure 113/55, pulse 112, temperature 97.4 F (36.3 C), temperature source Oral, resp. rate 18, height 5\' 1"  (1.549 m), weight 98.884 kg (218 lb), last menstrual period 03/29/2012, SpO2 98.00%. Filed Vitals:   12/12/12 0358 12/12/12 0403 12/12/12 0408 12/12/12 0413  BP:      Pulse: 113 112 113 112  Temp:    97.4 F (36.3 C)  TempSrc:    Oral  Resp:      Height:      Weight:      SpO2: 98% 97% 98% 98%    Exam   Physical Exam  Constitutional: She is oriented to person, place, and time. She appears well-developed and well-nourished. No distress.  HENT:  Head: Normocephalic and atraumatic.  Nose: Nose normal.  Eyes: Conjunctivae are normal. Pupils are equal, round, and reactive to light. Right eye exhibits no discharge. Left eye exhibits no discharge. No scleral icterus.   Neck: Normal range of motion. Neck supple. No JVD present. No tracheal deviation present. No thyromegaly present.  Cardiovascular: Regular rhythm, normal heart sounds and intact distal pulses.  Exam reveals no gallop and no friction rub.   No murmur heard. Tachycardia   Respiratory: Effort normal and breath sounds normal. No stridor. No respiratory distress. She has no wheezes. She has no rales. She exhibits no tenderness.  GI: Soft. Bowel sounds are normal. She exhibits no distension and no mass. There is no tenderness. There is no rebound and no guarding.  Musculoskeletal: Normal range of motion. She exhibits no edema and no tenderness.  Lymphadenopathy:    She has no cervical adenopathy.  Neurological: She is alert and oriented to person, place, and time.  Skin: Skin is warm. No rash noted. She is diaphoretic. No erythema. No pallor.  Psychiatric: She has a normal mood and affect. Her behavior is normal. Judgment and thought content normal.    FHT: 150s mod var mult accels, no decels. Periods of 200s with mod var, no decel Toco: q5-8 non painful  CBC    Component Value Date/Time   WBC 14.3* 12/12/2012 0250   RBC 4.01 12/12/2012 0250   HGB 12.0 12/12/2012 0250   HCT 35.5* 12/12/2012 0250   PLT 339 12/12/2012 0250   MCV 88.5 12/12/2012 0250   MCH 29.9 12/12/2012 0250  MCHC 33.8 12/12/2012 0250   RDW 13.8 12/12/2012 0250   LYMPHSABS 2.2 05/04/2012 1129   MONOABS 0.9 05/04/2012 1129   EOSABS 0.1 05/04/2012 1129   BASOSABS 0.0 05/04/2012 1129   Results for Joann, [name] (MRN [id number]) as of 12/12/2012 04:56  Ref. Range 12/12/2012 01:50  Color, Urine Latest Range: YELLOW  YELLOW  APPearance Latest Range: CLEAR  CLEAR  Specific Gravity, Urine Latest Range: 1.005-1.030  1.020  pH Latest Range: 5.0-8.0  6.5  Glucose Latest Range: NEGATIVE mg/dL NEGATIVE  Bilirubin Urine Latest Range: NEGATIVE  NEGATIVE  Ketones, ur Latest Range: NEGATIVE mg/dL >96 (A)  Protein Latest Range: NEGATIVE mg/dL NEGATIVE   Urobilinogen, UA Latest Range: 0.0-1.0 mg/dL 0.2  Nitrite Latest Range: NEGATIVE  NEGATIVE  Leukocytes, UA Latest Range: NEGATIVE  NEGATIVE  Hgb urine dipstick Latest Range: NEGATIVE  NEGATIVE   Prenatal labs: ABO, Rh:   Antibody: NEG (06/26 0902) Rubella:   RPR: NON REAC (06/26 0902)  HBsAg:    HIV: NON REACTIVE (06/26 0902)  GBS:     Dilation: 2 Effacement (%): 50 Station: -3 Exam by:: Dr Ike Bene   Assessment/Plan: [name] is a 27 y.o. G2P1001 at [redacted]w[redacted]d presenting with acute febrile illness. Likely viral given daughter recently ill as well.  #Febrile illness: 3/4 SIRS criteria with resolution given tylenol. Pt overall appears reassuring but will draw blood cultures and monitor for 24hrs for recurrent fever or worsening sx. No abx at this time. Will observe and consider starting for worsening of sx. UA neg. Can consider FFWU if recurs vs monitoring. Current plan to monitor.  #GDMA1: no acute interventions at this time.  - continue to check fasting and 2hr PP sugars - diabetic diet  #pregnancy: overall no acute issues. Will continue to monitor infant. If change or starts having sx of labor will check pt. No need to repeat cervical exam. Discharge reported, none seen in vault, insufficient for slide, pt reports had stopped. No further investigation at this time. May reevaulate as needed FWB: Cat II for tachycardia Pain: no issues  Lawrnce Reyez RYAN 12/12/2012, 4:49 AM

## 2012-12-12 NOTE — MAU Note (Signed)
Pt states had a fever of 100.5 at home. States some LOF that has an odor. Denies vaginal bleeding. States good FM.

## 2012-12-12 NOTE — Plan of Care (Signed)
Problem: Consults Goal: Birthing Suites Patient Information Press F2 to bring up selections list Outcome: Completed/Met Date Met:  12/12/12  Pt < [redacted] weeks EGA

## 2012-12-13 ENCOUNTER — Ambulatory Visit (INDEPENDENT_AMBULATORY_CARE_PROVIDER_SITE_OTHER): Payer: Medicaid Other | Admitting: Women's Health

## 2012-12-13 ENCOUNTER — Encounter: Payer: Self-pay | Admitting: Women's Health

## 2012-12-13 ENCOUNTER — Telehealth: Payer: Self-pay | Admitting: Obstetrics and Gynecology

## 2012-12-13 VITALS — BP 102/70 | Wt 214.5 lb

## 2012-12-13 DIAGNOSIS — Z331 Pregnant state, incidental: Secondary | ICD-10-CM

## 2012-12-13 DIAGNOSIS — O99019 Anemia complicating pregnancy, unspecified trimester: Secondary | ICD-10-CM

## 2012-12-13 DIAGNOSIS — O368131 Decreased fetal movements, third trimester, fetus 1: Secondary | ICD-10-CM

## 2012-12-13 DIAGNOSIS — Z1389 Encounter for screening for other disorder: Secondary | ICD-10-CM

## 2012-12-13 DIAGNOSIS — Z3483 Encounter for supervision of other normal pregnancy, third trimester: Secondary | ICD-10-CM

## 2012-12-13 DIAGNOSIS — O368190 Decreased fetal movements, unspecified trimester, not applicable or unspecified: Secondary | ICD-10-CM

## 2012-12-13 DIAGNOSIS — R509 Fever, unspecified: Secondary | ICD-10-CM

## 2012-12-13 DIAGNOSIS — O99810 Abnormal glucose complicating pregnancy: Secondary | ICD-10-CM

## 2012-12-13 LAB — POCT URINALYSIS DIPSTICK: Nitrite, UA: NEGATIVE

## 2012-12-13 LAB — GLUCOSE, CAPILLARY: Glucose-Capillary: 76 mg/dL (ref 70–99)

## 2012-12-13 NOTE — Patient Instructions (Signed)
Hand, Foot, and Mouth Disease  Hand, foot, and mouth disease is a common viral illness. It occurs mainly in children younger than 26 years of age, but adolescents and adults may also get it. This disease is different than foot and mouth disease that cattle, sheep, and pigs get. Most people are better in 1 week.  CAUSES   Hand, foot, and mouth disease is usually caused by a group of viruses called enteroviruses. Hand, foot, and mouth disease can spread from person to person (contagious). A person is most contagious during the first week of the illness. It is not transmitted to or from pets or other animals. It is most common in the summer and early fall. Infection is spread from person to person by direct contact with an infected person's:  · Nose discharge.  · Throat discharge.  · Stool.  SYMPTOMS   Open sores (ulcers) occur in the mouth. Symptoms may also include:  · A rash on the hands and feet, and occasionally the buttocks.  · Fever.  · Aches.  · Pain from the mouth ulcers.  · Fussiness.  DIAGNOSIS   Hand, foot, and mouth disease is one of many infections that cause mouth sores. To be certain your child has hand, foot, and mouth disease your caregiver will diagnose your child by physical exam. Additional tests are not usually needed.  TREATMENT   Nearly all patients recover without medical treatment in 7 to 10 days. There are no common complications. Your child should only take over-the-counter or prescription medicines for pain, discomfort, or fever as directed by your caregiver. Your caregiver may recommend the use of an over-the-counter antacid or a combination of an antacid and diphenhydramine to help coat the lesions in the mouth and improve symptoms.   HOME CARE INSTRUCTIONS  · Try combinations of foods to see what your child will tolerate and aim for a balanced diet. Soft foods may be easier to swallow. The mouth sores from hand, foot, and mouth disease typically hurt and are painful when exposed to  salty, spicy, or acidic food or drinks.  · Milk and cold drinks are soothing for some patients. Milk shakes, frozen ice pops, slushies, and sherberts are usually well tolerated.  · Sport drinks are good choices for hydration, and they also provide a few calories. Often, a child with hand, foot, and mouth disease will be able to drink without discomfort.    · For younger children and infants, feeding with a cup, spoon, or syringe may be less painful than drinking through the nipple of a bottle.  · Keep children out of childcare programs, schools, or other group settings during the first few days of the illness or until they are without fever. The sores on the body are not contagious.  SEEK IMMEDIATE MEDICAL CARE IF:  · Your child develops signs of dehydration such as:  · Decreased urination.  · Dry mouth, tongue, or lips.  · Decreased tears or sunken eyes.  · Dry skin.  · Rapid breathing.  · Fussy behavior.  · Poor color or pale skin.  · Fingertips taking longer than 2 seconds to turn pink after a gentle squeeze.  · Rapid weight loss.  · Your child does not have adequate pain relief.  · Your child develops a severe headache, stiff neck, or change in behavior.  · Your child develops ulcers or blisters that occur on the lips or outside of the mouth.  Document Released: 12/27/2002 Document Revised: 06/22/2011 Document Reviewed: 09/11/2010    [employer]® Patient Information ©2014 [employer].

## 2012-12-13 NOTE — Progress Notes (Addendum)
Work-in. Reports decreased fm. D/C'd from [facility] this am d/t febrile illness. States she noticed red bumps on her feet that are tender and itchy after being d/c'd. Niece has suspected hand, foot, mouth disease.  Denies fever since being d/c'd, uc's, lof, vb, urinary frequency, urgency, hesitancy, or dysuria. Co-exam w/ JVF- multiple small red bumps. No lesions on hands or in mouth. Reviewed labor s/s, fkc, to go to [facility] if fever returns or anything worsens.  GBS, gc/ch today. All questions answered. F/U tom as scheduled for u/s and visit.

## 2012-12-13 NOTE — Telephone Encounter (Signed)
Spoke with pt. Has been around someone with hand, foot, and mouth disease. Pt now has blisters on her feet. Advised to be worked in, and pt to get new RX for test strips while here. Pt checks sugar QID and pharmacy has that she only checks it once a day. JSY

## 2012-12-13 NOTE — Discharge Summary (Signed)
Physician Discharge Summary  Patient ID: [name] MRN: [id number] DOB/AGE: 1985-12-09 27 y.o.  Admit date: 12/12/2012 Discharge date: 12/13/2012  Admission Diagnoses: febrile illness  Discharge Diagnoses: viral syndrome Active Problems:   * No active hospital problems. *   Discharged Condition: good  Hospital Course: Patient presented to MAU with a complaint of sick contact and fever at home as high as 101.5. Patient was admitted for overnight observation. Throughout her stay, she remained afebrile, her symptoms improved and fetus remained stable. Patient was found stable for discharge with plans to follow up with Ob on Wednesday  Consults: None   Treatments: IV hydration  Discharge Exam: Blood pressure 101/57, pulse 87, temperature 98.3 F (36.8 C), temperature source Oral, resp. rate 18, height 5\' 1"  (1.549 m), weight 218 lb (98.884 kg), last menstrual period 03/29/2012, SpO2 98.00%. General appearance: alert, cooperative and no distress Resp: clear to auscultation bilaterally Cardio: regular rate and rhythm Extremities: Homans sign is negative, no sign of DVT and no edema, redness or tenderness in the calves or thighs NST: baseline 150, mod variability, positive accels, no decels. Contractions: none  Disposition: 01-Home or Self Care   Future Appointments Provider Department Dept Phone   12/14/2012 1:30 PM [facility] [phone]   12/14/2012 2:00 PM Jacklyn Shell, CNM [facility] [phone]       Medication List         glucose blood test strip  Use as instructed     ONE TOUCH ULTRA SYSTEM KIT W/DEVICE Kit  1 kit by Does not apply route once.           Follow-up Information   Please follow up. (As scheduled on Wednesday)       Signed: Dryden Tapley 12/13/2012, 6:50 AM

## 2012-12-14 ENCOUNTER — Ambulatory Visit (INDEPENDENT_AMBULATORY_CARE_PROVIDER_SITE_OTHER): Payer: Medicaid Other

## 2012-12-14 ENCOUNTER — Other Ambulatory Visit: Payer: Self-pay | Admitting: Obstetrics & Gynecology

## 2012-12-14 ENCOUNTER — Other Ambulatory Visit: Payer: Self-pay | Admitting: Advanced Practice Midwife

## 2012-12-14 ENCOUNTER — Encounter: Payer: Self-pay | Admitting: Advanced Practice Midwife

## 2012-12-14 ENCOUNTER — Ambulatory Visit (INDEPENDENT_AMBULATORY_CARE_PROVIDER_SITE_OTHER): Payer: Medicaid Other | Admitting: Advanced Practice Midwife

## 2012-12-14 VITALS — BP 102/64 | Wt 214.5 lb

## 2012-12-14 DIAGNOSIS — Z1389 Encounter for screening for other disorder: Secondary | ICD-10-CM

## 2012-12-14 DIAGNOSIS — O09529 Supervision of elderly multigravida, unspecified trimester: Secondary | ICD-10-CM

## 2012-12-14 DIAGNOSIS — E119 Type 2 diabetes mellitus without complications: Secondary | ICD-10-CM

## 2012-12-14 DIAGNOSIS — Z331 Pregnant state, incidental: Secondary | ICD-10-CM

## 2012-12-14 DIAGNOSIS — O99810 Abnormal glucose complicating pregnancy: Secondary | ICD-10-CM

## 2012-12-14 DIAGNOSIS — O26849 Uterine size-date discrepancy, unspecified trimester: Secondary | ICD-10-CM

## 2012-12-14 LAB — POCT URINALYSIS DIPSTICK
Blood, UA: NEGATIVE
Glucose, UA: NEGATIVE
Nitrite, UA: NEGATIVE

## 2012-12-14 LAB — OB RESULTS CONSOLE GC/CHLAMYDIA: Gonorrhea: NEGATIVE

## 2012-12-14 MED ORDER — GLUCOSE BLOOD VI STRP: ORAL_STRIP | Status: DC

## 2012-12-14 NOTE — Progress Notes (Signed)
U/S(37+1wks)-vtx,active fetus, approp growth EFW 6 lb 12 oz (48th%tile), fluid wnl AFI-13.6cm, ant gr 3 plac, BPP 8/8

## 2012-12-14 NOTE — Progress Notes (Signed)
Had U/S today (see below).  Blood sugars 69-115.  Rash on feet looks like coxsackie.  No more fevers.

## 2012-12-16 ENCOUNTER — Encounter: Payer: Self-pay | Admitting: Women's Health

## 2012-12-19 LAB — CULTURE, BLOOD (ROUTINE X 2): Culture: NO GROWTH

## 2012-12-20 ENCOUNTER — Encounter: Payer: Self-pay | Admitting: Obstetrics & Gynecology

## 2012-12-20 ENCOUNTER — Ambulatory Visit (INDEPENDENT_AMBULATORY_CARE_PROVIDER_SITE_OTHER): Payer: Medicaid Other | Admitting: Obstetrics & Gynecology

## 2012-12-20 VITALS — BP 110/60 | Wt 217.0 lb

## 2012-12-20 DIAGNOSIS — Z1389 Encounter for screening for other disorder: Secondary | ICD-10-CM

## 2012-12-20 DIAGNOSIS — O99019 Anemia complicating pregnancy, unspecified trimester: Secondary | ICD-10-CM

## 2012-12-20 DIAGNOSIS — O99810 Abnormal glucose complicating pregnancy: Secondary | ICD-10-CM | POA: Insufficient documentation

## 2012-12-20 DIAGNOSIS — Z331 Pregnant state, incidental: Secondary | ICD-10-CM

## 2012-12-20 LAB — POCT URINALYSIS DIPSTICK
Glucose, UA: NEGATIVE
Protein, UA: NEGATIVE

## 2012-12-20 NOTE — Progress Notes (Signed)
Lost mucous plug.

## 2012-12-20 NOTE — Progress Notes (Signed)
BS "really good"and I certainly agree. BP weight and urine results all reviewed and noted. Patient reports good fetal movement, denies any bleeding and no rupture of membranes symptoms or regular contractions. Patient is without complaints. All questions were answered.

## 2012-12-22 ENCOUNTER — Inpatient Hospital Stay (HOSPITAL_COMMUNITY)
Admission: AD | Admit: 2012-12-22 | Discharge: 2012-12-22 | Disposition: A | Discharge status: Home / Self Care | Payer: Medicaid Other | Source: Ambulatory Visit | Attending: Obstetrics & Gynecology | Admitting: Obstetrics & Gynecology

## 2012-12-22 ENCOUNTER — Encounter: Payer: Self-pay | Admitting: Obstetrics & Gynecology

## 2012-12-22 ENCOUNTER — Encounter (HOSPITAL_COMMUNITY): Payer: Self-pay | Admitting: *Deleted

## 2012-12-22 DIAGNOSIS — N898 Other specified noninflammatory disorders of vagina: Secondary | ICD-10-CM

## 2012-12-22 DIAGNOSIS — O479 False labor, unspecified: Secondary | ICD-10-CM | POA: Insufficient documentation

## 2012-12-22 DIAGNOSIS — N949 Unspecified condition associated with female genital organs and menstrual cycle: Secondary | ICD-10-CM | POA: Insufficient documentation

## 2012-12-22 DIAGNOSIS — O9989 Other specified diseases and conditions complicating pregnancy, childbirth and the puerperium: Secondary | ICD-10-CM

## 2012-12-22 NOTE — Progress Notes (Signed)
Patient ID: [name], female   DOB: 08-28-1985, 27 y.o.   MRN: [id number] PATIENT CALLED STATING SHE HAD A THICK GREEN DISCHARGE AND WAS HAVING SOME IRREGULAR CONTRACTIONS.  SPOKE WITH THE NURSE (CHRYSTAL), ADVISED TO GO TO [facility] TO BE CHECKED. PATIENT ADVISED TO GO TO [facility].    [name]

## 2012-12-22 NOTE — MAU Note (Signed)
Patient states she has had a thick vaginal discharge for about one week. Has been having irregular contractions for 2 weeks. Denies bleeding and reports fetal movement.

## 2012-12-22 NOTE — MAU Note (Signed)
Pt states she has been having discharge since (12/16/2012) when she lost her mucous plug

## 2012-12-27 ENCOUNTER — Encounter: Payer: Self-pay | Admitting: Advanced Practice Midwife

## 2012-12-27 ENCOUNTER — Telehealth (HOSPITAL_COMMUNITY): Payer: Self-pay | Admitting: *Deleted

## 2012-12-27 ENCOUNTER — Ambulatory Visit (INDEPENDENT_AMBULATORY_CARE_PROVIDER_SITE_OTHER): Payer: Medicaid Other | Admitting: Advanced Practice Midwife

## 2012-12-27 ENCOUNTER — Encounter (HOSPITAL_COMMUNITY): Payer: Self-pay | Admitting: *Deleted

## 2012-12-27 VITALS — BP 128/80 | Wt 214.8 lb

## 2012-12-27 DIAGNOSIS — O99810 Abnormal glucose complicating pregnancy: Secondary | ICD-10-CM

## 2012-12-27 DIAGNOSIS — Z1389 Encounter for screening for other disorder: Secondary | ICD-10-CM

## 2012-12-27 DIAGNOSIS — O99019 Anemia complicating pregnancy, unspecified trimester: Secondary | ICD-10-CM

## 2012-12-27 DIAGNOSIS — Z331 Pregnant state, incidental: Secondary | ICD-10-CM

## 2012-12-27 LAB — POCT URINALYSIS DIPSTICK
Ketones, UA: NEGATIVE
Nitrite, UA: NEGATIVE
Protein, UA: NEGATIVE

## 2012-12-27 NOTE — Telephone Encounter (Signed)
Preadmission screen  

## 2012-12-27 NOTE — Patient Instructions (Addendum)
If you are still pregnant on 9/23 at 6:30am, come to [facility]missions Unit ([address] in [place], [place]) to start your induction!  Eat a light meal before you come.  Daisey Must!!

## 2012-12-27 NOTE — Progress Notes (Signed)
IOL AT 0630 01/03/13 if undelivered.

## 2012-12-27 NOTE — Progress Notes (Signed)
Pt feels like baby is "higher up" today.  Vertex confirmed by u/s.  EFW 48% at 37 weeks.  BS excellent!  No c/o at this time.  Routine questions about pregnancy answered.

## 2012-12-30 ENCOUNTER — Inpatient Hospital Stay (HOSPITAL_COMMUNITY)
Admission: AD | Admit: 2012-12-30 | Discharge: 2012-12-31 | Disposition: A | Discharge status: Home / Self Care | Payer: Medicaid Other | Source: Ambulatory Visit | Attending: Obstetrics and Gynecology | Admitting: Obstetrics and Gynecology

## 2012-12-30 ENCOUNTER — Encounter (HOSPITAL_COMMUNITY): Payer: Self-pay | Admitting: *Deleted

## 2012-12-30 DIAGNOSIS — O99891 Other specified diseases and conditions complicating pregnancy: Secondary | ICD-10-CM | POA: Insufficient documentation

## 2012-12-30 DIAGNOSIS — O479 False labor, unspecified: Secondary | ICD-10-CM | POA: Insufficient documentation

## 2012-12-30 NOTE — MAU Note (Signed)
Leaking clear fld since weds but is getting worse. Contractions for 3 wks and getting stronger but irregular

## 2012-12-31 LAB — WET PREP, GENITAL: Yeast Wet Prep HPF POC: NONE SEEN

## 2012-12-31 NOTE — Progress Notes (Signed)
Awaiting a call back from dr Reola Calkins

## 2012-12-31 NOTE — MAU Note (Signed)
Patient is in for a labor eval and possible leaking amniotic fluids since Wednesday. Patient states that she have regular contraction all week but the are more painful tonight and leaking fluid (patient states that the discharge is mucus). Yellow stringy mucus noted at vaginal opening. Denies vaginal bleeding. Reports good fetal movement

## 2012-12-31 NOTE — Progress Notes (Signed)
Dr Reola Calkins notified of patient. Tracing, ctx pattern and sve result. Order to offer patient to ambulate and recheck in about an hour. Call with update

## 2013-01-02 ENCOUNTER — Inpatient Hospital Stay (HOSPITAL_COMMUNITY)
Admission: AD | Admit: 2013-01-02 | Discharge: 2013-01-03 | DRG: 767 | Disposition: A | Discharge status: Home / Self Care | Payer: Medicaid Other | Source: Ambulatory Visit | Attending: Obstetrics & Gynecology | Admitting: Obstetrics & Gynecology

## 2013-01-02 ENCOUNTER — Encounter (HOSPITAL_COMMUNITY): Payer: Self-pay | Admitting: Registered Nurse

## 2013-01-02 ENCOUNTER — Inpatient Hospital Stay (HOSPITAL_COMMUNITY): Payer: Medicaid Other | Admitting: Anesthesiology

## 2013-01-02 ENCOUNTER — Encounter (HOSPITAL_COMMUNITY)
Admission: AD | Disposition: A | Discharge status: Home / Self Care | Payer: Self-pay | Source: Ambulatory Visit | Attending: Obstetrics & Gynecology

## 2013-01-02 ENCOUNTER — Encounter (HOSPITAL_COMMUNITY): Payer: Self-pay | Admitting: *Deleted

## 2013-01-02 ENCOUNTER — Inpatient Hospital Stay (HOSPITAL_COMMUNITY): Payer: Medicaid Other | Admitting: Registered Nurse

## 2013-01-02 ENCOUNTER — Encounter (HOSPITAL_COMMUNITY): Payer: Self-pay | Admitting: Anesthesiology

## 2013-01-02 DIAGNOSIS — O9989 Other specified diseases and conditions complicating pregnancy, childbirth and the puerperium: Secondary | ICD-10-CM

## 2013-01-02 DIAGNOSIS — O99892 Other specified diseases and conditions complicating childbirth: Secondary | ICD-10-CM | POA: Diagnosis present

## 2013-01-02 DIAGNOSIS — Z302 Encounter for sterilization: Secondary | ICD-10-CM

## 2013-01-02 DIAGNOSIS — IMO0001 Reserved for inherently not codable concepts without codable children: Secondary | ICD-10-CM

## 2013-01-02 DIAGNOSIS — O99814 Abnormal glucose complicating childbirth: Secondary | ICD-10-CM | POA: Diagnosis present

## 2013-01-02 DIAGNOSIS — O24410 Gestational diabetes mellitus in pregnancy, diet controlled: Secondary | ICD-10-CM

## 2013-01-02 DIAGNOSIS — Z22330 Carrier of Group B streptococcus: Secondary | ICD-10-CM

## 2013-01-02 HISTORY — PX: TUBAL LIGATION: SHX77

## 2013-01-02 LAB — CBC
MCH: 30.3 pg (ref 26.0–34.0)
MCHC: 34.2 g/dL (ref 30.0–36.0)
Platelets: 420 10*3/uL — ABNORMAL HIGH (ref 150–400)
RBC: 4.13 MIL/uL (ref 3.87–5.11)
WBC: 12.6 10*3/uL — ABNORMAL HIGH (ref 4.0–10.5)

## 2013-01-02 LAB — GLUCOSE, CAPILLARY

## 2013-01-02 LAB — RPR: RPR Ser Ql: NONREACTIVE

## 2013-01-02 LAB — TYPE AND SCREEN: ABO/RH(D): A POS

## 2013-01-02 SURGERY — LIGATION, FALLOPIAN TUBE, POSTPARTUM
Anesthesia: Epidural | Site: Abdomen | Laterality: Bilateral | Wound class: Clean

## 2013-01-02 MED ORDER — MEPERIDINE HCL 25 MG/ML IJ SOLN: 6.2500 mg | INTRAMUSCULAR | Status: DC | PRN

## 2013-01-02 MED ORDER — EPHEDRINE 5 MG/ML INJ
10.0000 mg | INTRAVENOUS | Status: DC | PRN
  Filled 2013-01-02: qty 4

## 2013-01-02 MED ORDER — LANOLIN HYDROUS EX OINT: TOPICAL_OINTMENT | CUTANEOUS | Status: DC | PRN

## 2013-01-02 MED ORDER — PHENYLEPHRINE 40 MCG/ML (10ML) SYRINGE FOR IV PUSH (FOR BLOOD PRESSURE SUPPORT)
80.0000 ug | PREFILLED_SYRINGE | INTRAVENOUS | Status: DC | PRN
  Filled 2013-01-02: qty 5

## 2013-01-02 MED ORDER — SENNOSIDES-DOCUSATE SODIUM 8.6-50 MG PO TABS
2.0000 | ORAL_TABLET | ORAL | Status: DC
  Administered 2013-01-03: 2 via ORAL

## 2013-01-02 MED ORDER — LIDOCAINE HCL (CARDIAC) 20 MG/ML IV SOLN
INTRAVENOUS | Status: DC | PRN
  Administered 2013-01-02: 30 mg via INTRAVENOUS

## 2013-01-02 MED ORDER — PNEUMOCOCCAL VAC POLYVALENT 25 MCG/0.5ML IJ INJ
0.5000 mL | INJECTION | INTRAMUSCULAR | Status: DC
  Filled 2013-01-02: qty 0.5

## 2013-01-02 MED ORDER — BUPIVACAINE HCL (PF) 0.25 % IJ SOLN
INTRAMUSCULAR | Status: DC | PRN
  Administered 2013-01-02: 30 mL

## 2013-01-02 MED ORDER — FENTANYL CITRATE 0.05 MG/ML IJ SOLN
INTRAMUSCULAR | Status: AC
  Filled 2013-01-02: qty 2

## 2013-01-02 MED ORDER — IBUPROFEN 600 MG PO TABS: 600.0000 mg | ORAL_TABLET | Freq: Four times a day (QID) | ORAL | Status: DC | PRN

## 2013-01-02 MED ORDER — SIMETHICONE 80 MG PO CHEW
80.0000 mg | CHEWABLE_TABLET | ORAL | Status: DC | PRN
  Administered 2013-01-02 (×2): 80 mg via ORAL

## 2013-01-02 MED ORDER — 0.9 % SODIUM CHLORIDE (POUR BTL) OPTIME
TOPICAL | Status: DC | PRN
  Administered 2013-01-02: 1000 mL

## 2013-01-02 MED ORDER — OXYCODONE-ACETAMINOPHEN 5-325 MG PO TABS: 1.0000 | ORAL_TABLET | ORAL | Status: DC | PRN

## 2013-01-02 MED ORDER — BUPIVACAINE-EPINEPHRINE PF 0.25-1:200000 % IJ SOLN
INTRAMUSCULAR | Status: AC
  Filled 2013-01-02: qty 30

## 2013-01-02 MED ORDER — SODIUM BICARBONATE 8.4 % IV SOLN
INTRAVENOUS | Status: AC
  Filled 2013-01-02: qty 50

## 2013-01-02 MED ORDER — ONDANSETRON HCL 4 MG/2ML IJ SOLN
INTRAMUSCULAR | Status: DC | PRN
  Administered 2013-01-02: 4 mg via INTRAVENOUS

## 2013-01-02 MED ORDER — PROPOFOL 10 MG/ML IV BOLUS
INTRAVENOUS | Status: DC | PRN
  Administered 2013-01-02 (×3): 20 mg via INTRAVENOUS

## 2013-01-02 MED ORDER — FENTANYL 2.5 MCG/ML BUPIVACAINE 1/10 % EPIDURAL INFUSION (WH - ANES)
14.0000 mL/h | INTRAMUSCULAR | Status: DC | PRN
  Administered 2013-01-02: 14 mL/h via EPIDURAL
  Filled 2013-01-02: qty 125

## 2013-01-02 MED ORDER — ONDANSETRON HCL 4 MG/2ML IJ SOLN: 4.0000 mg | Freq: Four times a day (QID) | INTRAMUSCULAR | Status: DC | PRN

## 2013-01-02 MED ORDER — PENICILLIN G POTASSIUM 5000000 UNITS IJ SOLR
2.5000 10*6.[IU] | INTRAVENOUS | Status: DC
  Administered 2013-01-02: 2.5 10*6.[IU] via INTRAVENOUS
  Filled 2013-01-02 (×6): qty 2.5

## 2013-01-02 MED ORDER — SODIUM BICARBONATE 8.4 % IV SOLN
INTRAVENOUS | Status: DC | PRN
  Administered 2013-01-02 (×3): 5 mL via EPIDURAL
  Administered 2013-01-02: 3 mL via EPIDURAL

## 2013-01-02 MED ORDER — ONDANSETRON HCL 4 MG/2ML IJ SOLN: 4.0000 mg | INTRAMUSCULAR | Status: DC | PRN

## 2013-01-02 MED ORDER — MIDAZOLAM HCL 5 MG/5ML IJ SOLN
INTRAMUSCULAR | Status: DC | PRN
  Administered 2013-01-02 (×2): 1 mg via INTRAVENOUS

## 2013-01-02 MED ORDER — WITCH HAZEL-GLYCERIN EX PADS: 1.0000 "application " | MEDICATED_PAD | CUTANEOUS | Status: DC | PRN

## 2013-01-02 MED ORDER — LACTATED RINGERS IV SOLN: 500.0000 mL | INTRAVENOUS | Status: DC | PRN

## 2013-01-02 MED ORDER — FLEET ENEMA 7-19 GM/118ML RE ENEM: 1.0000 | ENEMA | RECTAL | Status: DC | PRN

## 2013-01-02 MED ORDER — EPHEDRINE 5 MG/ML INJ: 10.0000 mg | INTRAVENOUS | Status: DC | PRN

## 2013-01-02 MED ORDER — ZOLPIDEM TARTRATE 5 MG PO TABS: 5.0000 mg | ORAL_TABLET | Freq: Every evening | ORAL | Status: DC | PRN

## 2013-01-02 MED ORDER — KETOROLAC TROMETHAMINE 30 MG/ML IJ SOLN: 15.0000 mg | Freq: Once | INTRAMUSCULAR | Status: DC | PRN

## 2013-01-02 MED ORDER — PROMETHAZINE HCL 25 MG/ML IJ SOLN: 6.2500 mg | INTRAMUSCULAR | Status: DC | PRN

## 2013-01-02 MED ORDER — OXYTOCIN BOLUS FROM INFUSION: 500.0000 mL | INTRAVENOUS | Status: DC

## 2013-01-02 MED ORDER — INFLUENZA VAC SPLIT QUAD 0.5 ML IM SUSP
0.5000 mL | INTRAMUSCULAR | Status: AC
  Administered 2013-01-03: 0.5 mL via INTRAMUSCULAR
  Filled 2013-01-02: qty 0.5

## 2013-01-02 MED ORDER — CITRIC ACID-SODIUM CITRATE 334-500 MG/5ML PO SOLN
30.0000 mL | ORAL | Status: DC | PRN
  Administered 2013-01-02: 30 mL via ORAL
  Filled 2013-01-02: qty 15

## 2013-01-02 MED ORDER — DIPHENHYDRAMINE HCL 25 MG PO CAPS: 25.0000 mg | ORAL_CAPSULE | Freq: Four times a day (QID) | ORAL | Status: DC | PRN

## 2013-01-02 MED ORDER — DIPHENHYDRAMINE HCL 50 MG/ML IJ SOLN: 12.5000 mg | INTRAMUSCULAR | Status: DC | PRN

## 2013-01-02 MED ORDER — OXYTOCIN 40 UNITS IN LACTATED RINGERS INFUSION - SIMPLE MED
62.5000 mL/h | INTRAVENOUS | Status: DC
  Administered 2013-01-02: 999 mL/h via INTRAVENOUS
  Filled 2013-01-02: qty 1000

## 2013-01-02 MED ORDER — ONDANSETRON HCL 4 MG/2ML IJ SOLN
INTRAMUSCULAR | Status: AC
  Filled 2013-01-02: qty 2

## 2013-01-02 MED ORDER — PHENYLEPHRINE 40 MCG/ML (10ML) SYRINGE FOR IV PUSH (FOR BLOOD PRESSURE SUPPORT): 80.0000 ug | PREFILLED_SYRINGE | INTRAVENOUS | Status: DC | PRN

## 2013-01-02 MED ORDER — LIDOCAINE-EPINEPHRINE (PF) 2 %-1:200000 IJ SOLN
INTRAMUSCULAR | Status: AC
  Filled 2013-01-02: qty 20

## 2013-01-02 MED ORDER — HYDROMORPHONE HCL PF 1 MG/ML IJ SOLN: 0.2500 mg | INTRAMUSCULAR | Status: DC | PRN

## 2013-01-02 MED ORDER — ACETAMINOPHEN 325 MG PO TABS: 650.0000 mg | ORAL_TABLET | ORAL | Status: DC | PRN

## 2013-01-02 MED ORDER — LIDOCAINE HCL (PF) 1 % IJ SOLN
30.0000 mL | INTRAMUSCULAR | Status: DC | PRN
  Administered 2013-01-02: 30 mL via SUBCUTANEOUS
  Filled 2013-01-02 (×2): qty 30

## 2013-01-02 MED ORDER — TETANUS-DIPHTH-ACELL PERTUSSIS 5-2.5-18.5 LF-MCG/0.5 IM SUSP
0.5000 mL | Freq: Once | INTRAMUSCULAR | Status: AC
  Administered 2013-01-03: 0.5 mL via INTRAMUSCULAR
  Filled 2013-01-02: qty 0.5

## 2013-01-02 MED ORDER — ONDANSETRON HCL 4 MG PO TABS: 4.0000 mg | ORAL_TABLET | ORAL | Status: DC | PRN

## 2013-01-02 MED ORDER — OXYCODONE-ACETAMINOPHEN 5-325 MG PO TABS
1.0000 | ORAL_TABLET | ORAL | Status: DC | PRN
  Administered 2013-01-02 (×2): 1 via ORAL
  Filled 2013-01-02 (×2): qty 1

## 2013-01-02 MED ORDER — DIBUCAINE 1 % RE OINT: 1.0000 "application " | TOPICAL_OINTMENT | RECTAL | Status: DC | PRN

## 2013-01-02 MED ORDER — PRENATAL MULTIVITAMIN CH
1.0000 | ORAL_TABLET | Freq: Every day | ORAL | Status: DC
  Administered 2013-01-02 – 2013-01-03 (×2): 1 via ORAL
  Filled 2013-01-02 (×2): qty 1

## 2013-01-02 MED ORDER — IBUPROFEN 600 MG PO TABS
600.0000 mg | ORAL_TABLET | Freq: Four times a day (QID) | ORAL | Status: DC
  Administered 2013-01-02 – 2013-01-03 (×5): 600 mg via ORAL
  Filled 2013-01-02 (×5): qty 1

## 2013-01-02 MED ORDER — MIDAZOLAM HCL 2 MG/2ML IJ SOLN
INTRAMUSCULAR | Status: AC
  Filled 2013-01-02: qty 2

## 2013-01-02 MED ORDER — BENZOCAINE-MENTHOL 20-0.5 % EX AERO: 1.0000 "application " | INHALATION_SPRAY | CUTANEOUS | Status: DC | PRN

## 2013-01-02 MED ORDER — DEXTROSE 5 % IV SOLN
5.0000 10*6.[IU] | Freq: Once | INTRAVENOUS | Status: AC
  Administered 2013-01-02: 5 10*6.[IU] via INTRAVENOUS
  Filled 2013-01-02: qty 5

## 2013-01-02 MED ORDER — BUPIVACAINE HCL (PF) 0.25 % IJ SOLN
INTRAMUSCULAR | Status: AC
  Filled 2013-01-02: qty 30

## 2013-01-02 MED ORDER — FENTANYL CITRATE 0.05 MG/ML IJ SOLN
INTRAMUSCULAR | Status: DC | PRN
  Administered 2013-01-02 (×2): 50 ug via INTRAVENOUS

## 2013-01-02 MED ORDER — PROPOFOL 10 MG/ML IV EMUL
INTRAVENOUS | Status: AC
  Filled 2013-01-02: qty 20

## 2013-01-02 MED ORDER — LACTATED RINGERS IV SOLN
INTRAVENOUS | Status: DC
  Administered 2013-01-02 (×3): via INTRAVENOUS

## 2013-01-02 MED ORDER — LIDOCAINE HCL (CARDIAC) 20 MG/ML IV SOLN
INTRAVENOUS | Status: AC
  Filled 2013-01-02: qty 5

## 2013-01-02 MED ORDER — SODIUM BICARBONATE 8.4 % IV SOLN
INTRAVENOUS | Status: DC | PRN
  Administered 2013-01-02: 5 mL via EPIDURAL

## 2013-01-02 MED ORDER — LACTATED RINGERS IV SOLN: 500.0000 mL | Freq: Once | INTRAVENOUS | Status: DC

## 2013-01-02 SURGICAL SUPPLY — 22 items
CHLORAPREP W/TINT 26ML (MISCELLANEOUS) ×2 IMPLANT
CONTAINER PREFILL 10% NBF 15ML (MISCELLANEOUS) ×4 IMPLANT
DRSG COVADERM PLUS 2X2 (GAUZE/BANDAGES/DRESSINGS) ×2 IMPLANT
GLOVE BIO SURGEON STRL SZ7 (GLOVE) ×2 IMPLANT
GLOVE BIOGEL PI IND STRL 6.5 (GLOVE) ×2 IMPLANT
GLOVE BIOGEL PI INDICATOR 6.5 (GLOVE) ×2
GLOVE INDICATOR 7.0 STRL GRN (GLOVE) ×4 IMPLANT
GLOVE NEODERM STER SZ 7 (GLOVE) ×2 IMPLANT
GLOVE SURG SS PI 6.0 STRL IVOR (GLOVE) ×2 IMPLANT
GOWN PREVENTION PLUS LG XLONG (DISPOSABLE) ×4 IMPLANT
NEEDLE HYPO 25X1 1.5 SAFETY (NEEDLE) ×2 IMPLANT
NS IRRIG 1000ML POUR BTL (IV SOLUTION) ×2 IMPLANT
PACK ABDOMINAL MINOR (CUSTOM PROCEDURE TRAY) ×2 IMPLANT
SPONGE LAP 4X18 X RAY DECT (DISPOSABLE) IMPLANT
SUT PLAIN 0 NONE (SUTURE) ×2 IMPLANT
SUT VIC AB 0 CT1 27 (SUTURE) ×1
SUT VIC AB 0 CT1 27XBRD ANBCTR (SUTURE) ×1 IMPLANT
SUT VIC AB 3-0 PS2 18 (SUTURE) ×2 IMPLANT
SYR CONTROL 10ML LL (SYRINGE) ×2 IMPLANT
TOWEL OR 17X24 6PK STRL BLUE (TOWEL DISPOSABLE) ×4 IMPLANT
TRAY FOLEY CATH 14FR (SET/KITS/TRAYS/PACK) ×2 IMPLANT
WATER STERILE IRR 1000ML POUR (IV SOLUTION) ×2 IMPLANT

## 2013-01-02 NOTE — Transfer of Care (Signed)
Immediate Anesthesia Transfer of Care Note  Patient: [name]  Procedure(s) Performed: Procedure(s): POST PARTUM TUBAL LIGATION (Bilateral)  Patient Location: PACU  Anesthesia Type:Epidural  Level of Consciousness: awake, alert  and oriented  Airway & Oxygen Therapy: Patient Spontanous Breathing  Post-op Assessment: Report given to PACU RN  Post vital signs: Reviewed  Complications: No apparent anesthesia complications

## 2013-01-02 NOTE — Anesthesia Preprocedure Evaluation (Signed)
Anesthesia Evaluation  Patient identified by MRN, date of birth, ID band Patient awake    Reviewed: Allergy & Precautions, H&P , Patient's Chart, lab work & pertinent test results  Airway Mallampati: II  TM Distance: >3 FB Neck ROM: full    Dental  (+) Teeth Intact   Pulmonary  breath sounds clear to auscultation        Cardiovascular Rhythm:regular Rate:Normal     Neuro/Psych    GI/Hepatic   Endo/Other  diabetes, GestationalMorbid obesity  Renal/GU      Musculoskeletal   Abdominal   Peds  Hematology   Anesthesia Other Findings       Reproductive/Obstetrics (+) Pregnancy                             Anesthesia Physical Anesthesia Plan  ASA: III  Anesthesia Plan: Epidural   Post-op Pain Management:    Induction:   Airway Management Planned:   Additional Equipment:   Intra-op Plan:   Post-operative Plan:   Informed Consent: I have reviewed the patients History and Physical, chart, labs and discussed the procedure including the risks, benefits and alternatives for the proposed anesthesia with the patient or authorized representative who has indicated his/her understanding and acceptance.   Dental Advisory Given  Plan Discussed with:   Anesthesia Plan Comments: (Labs checked- platelets confirmed with RN in room. Fetal heart tracing, per RN, reported to be stable enough for sitting procedure. Discussed epidural, and patient consents to the procedure:  included risk of possible headache,backache, failed block, allergic reaction, and nerve injury. This patient was asked if she had any questions or concerns before the procedure started.)        Anesthesia Quick Evaluation  

## 2013-01-02 NOTE — Anesthesia Postprocedure Evaluation (Signed)
  Anesthesia Post Note  Patient: [name]  Procedure(s) Performed: Procedure(s) (LRB): POST PARTUM TUBAL LIGATION (Bilateral)  Anesthesia type: Epidural  Patient location: PACU  Post pain: Pain level controlled  Post assessment: Post-op Vital signs reviewed  Last Vitals:  Filed Vitals:   01/02/13 1247  BP:   Pulse: 77  Temp: 37.2 C  Resp: 18    Post vital signs: Reviewed  Level of consciousness: awake  Complications: No apparent anesthesia complications

## 2013-01-02 NOTE — Anesthesia Preprocedure Evaluation (Signed)
Anesthesia Evaluation  Patient identified by MRN, date of birth, ID band Patient awake    Reviewed: Allergy & Precautions, H&P , NPO status , Patient's Chart, lab work & pertinent test results  Airway Mallampati: II TM Distance: >3 FB Neck ROM: full    Dental  (+) Teeth Intact   Pulmonary  breath sounds clear to auscultation        Cardiovascular Rhythm:regular Rate:Normal     Neuro/Psych    GI/Hepatic   Endo/Other  diabetes, GestationalMorbid obesity  Renal/GU      Musculoskeletal   Abdominal   Peds  Hematology   Anesthesia Other Findings       Reproductive/Obstetrics (+) Pregnancy                           Anesthesia Physical  Anesthesia Plan  ASA: III  Anesthesia Plan: Epidural   Post-op Pain Management:    Induction:   Airway Management Planned:   Additional Equipment:   Intra-op Plan:   Post-operative Plan:   Informed Consent: I have reviewed the patients History and Physical, chart, labs and discussed the procedure including the risks, benefits and alternatives for the proposed anesthesia with the patient or authorized representative who has indicated his/her understanding and acceptance.     Plan Discussed with: CRNA, Surgeon and Anesthesiologist  Anesthesia Plan Comments: (Labs checked- platelets confirmed with RN in room. Fetal heart tracing, per RN, reported to be stable enough for sitting procedure. Discussed epidural, and patient consents to the procedure:  included risk of possible headache,backache, failed block, allergic reaction, and nerve injury. This patient was asked if she had any questions or concerns before the procedure started. 10:59 pt in short stay for PPTL. Epidural In-Situ.)        Anesthesia Quick Evaluation

## 2013-01-02 NOTE — H&P (Signed)
[name] is a 27 y.o. female presenting for SROM at 0250 and spontaneous onset of contractions. Pt denies LOF and normal Fetal movmenet. Pt in otherwise normal state of health.. History OB History   Grav Para Term Preterm Abortions TAB SAB Ect Mult Living   2 1 1       1      Past Medical History  Diagnosis Date  . Medical history non-contributory   . Diabetes mellitus without complication   . Gestational diabetes    Past Surgical History  Procedure Laterality Date  . Orthopedic surgery     Family History: family history includes Cancer in her maternal grandfather and maternal uncle; Diabetes in her maternal grandfather and maternal grandmother; Hypertension in her father. Social History:  reports that she has never smoked. She has never used smokeless tobacco. She reports that  drinks alcohol. She reports that she does not use illicit drugs.   Prenatal Transfer Tool  Maternal Diabetes: Yes:  Diabetes Type:  Diet controlled Genetic Screening: Normal Maternal Ultrasounds/Referrals: Normal Fetal Ultrasounds or other Referrals:  None Maternal Substance Abuse:  No Significant Maternal Medications:  None Significant Maternal Lab Results:  Lab values include: Group B Strep positive Other Comments:  None  ROS In usual state of helath no other complaint.  Dilation: 7 Effacement (%): 100 Station: -2 Exam by:: LCarpenter,RN Blood pressure 118/73, pulse 100, temperature 97.6 F (36.4 C), temperature source Oral, resp. rate 20, height 5\' 2"  (1.575 m), weight 97.523 kg (215 lb), last menstrual period 03/29/2012, SpO2 100.00%. Exam Physical Exam  Constitutional: She appears well-developed and well-nourished. No distress.  HENT:  Head: Normocephalic and atraumatic.  Eyes: Conjunctivae and EOM are normal.  Neck: Normal range of motion.  Respiratory: Effort normal.  GI: Soft. Bowel sounds are normal. She exhibits distension (gravd leopod 3200g). She exhibits no mass. There is no  tenderness. There is no rebound and no guarding.  Musculoskeletal: Normal range of motion. She exhibits no edema and no tenderness.  Skin: Skin is warm and dry. No rash noted. She is not diaphoretic. No erythema. No pallor.  Psychiatric: She has a normal mood and affect. Her behavior is normal. Judgment and thought content normal.    150s mod variabilty, mult accels, occassional variable decels. Toco q2-3  Prenatal labs: ABO, Rh: --/--/A POS, A POS (09/01 0700) Antibody: NEG (09/01 0700) Rubella:   RPR: NON REAC (06/26 0902)  HBsAg:    HIV: NON REACTIVE (06/26 0902)  GBS: POSITIVE (09/02 1635)   Assessment/Plan: [name] [name] is a 27 y.o. G2P1001 at [redacted]w[redacted]d presets with SROM/SOOL  #Labor: expectant managment #Pain: Desires epidural #FWB: Cat II #ID: GBS+ starting PCN #MOF: Breast #MOC: BTL consent signed 8/19   Tawana Scale 01/02/2013, 5:23 AM

## 2013-01-02 NOTE — Progress Notes (Signed)
Patient ID: [name], female   DOB: October 14, 1985, 27 y.o.   MRN: [id number] INDICATIONS: 27 y.o. yo G2P2002  with undesired fertility,status post vaginal delivery, desires permanent sterilization. Risks and benefits of procedure discussed with patient including permanence of method, bleeding, infection, injury to surrounding organs and need for additional procedures. Risk failure of 0.5-1% with increased risk of ectopic gestation if pregnancy occurs was also discussed with patient.

## 2013-01-02 NOTE — Op Note (Signed)
[name] 01/02/2013  PREOPERATIVE DIAGNOSIS:  Undesired fertility  POSTOPERATIVE DIAGNOSIS:  Undesired fertility  PROCEDURE:  Postpartum Bilateral Tubal Sterilization using Pomeroy method   ANESTHESIA:  Epidural  SURGEON:  Dr. Gigi Gin Merek Niu  ASSISTANT: Dr. Rulon Abide  COMPLICATIONS:  None immediate.  ESTIMATED BLOOD LOSS:  Less than 20cc.  FLUIDS: 900 cc LR.  URINE OUTPUT:  300 cc of clear urine.  INDICATIONS: 27 y.o. yo G2P2002  with undesired fertility,status post vaginal delivery, desires permanent sterilization. Risks and benefits of procedure discussed with patient including permanence of method, bleeding, infection, injury to surrounding organs and need for additional procedures. Risk failure of 0.5-1% with increased risk of ectopic gestation if pregnancy occurs was also discussed with patient.   FINDINGS:  Normal uterus, tubes, and ovaries.  TECHNIQUE: After informed consent was obtained, the patient was taken to the operating room where anesthesia was induced and found to be adequate. A small transverse, infraumbilical skin incision was made with the scalpel. This incision was carried down to the underlying layer of fascia. The fascia was grasped with Kocher clamps tented up and entered sharply with Mayo scissors. Underlying peritoneum was then identified tented up and entered sharply with Metzenbaum scissors. The fascia was tagged with 0 Vicryl. The patient's left fallopian tube was then identified, brought to the incision, and grasped with a Babcock clamp. The tube was then followed out to the fimbria. The Babcock clamp was then used to grasp the tube approximately 4 cm from the cornual region. A 3 cm segment of the tube was then ligated with free tie of plain gut suture, transected and excised. Good hemostasis was noted and the tube was returned to the abdomen. The right fallopian tube was then identified to its fimbriated end, ligated, and a 3 cm segment excised in a  similar fashion. Excellent hemostasis was noted, and the tube returned to the abdomen. The fascia was re-approximated with 0 Vicryl. The skin was closed in a subcuticular fashion with 3-0 Vicryl. Quarter percent Marcaine solution was then injected at the incision site. The patient tolerated the procedure well. Sponge, lap, and needle count were correct x2. The patient was taken to recovery room in stable condition.

## 2013-01-02 NOTE — MAU Note (Signed)
Pt reports ROM at 0250, clear fluid. contractions

## 2013-01-02 NOTE — Anesthesia Procedure Notes (Signed)

## 2013-01-02 NOTE — Anesthesia Postprocedure Evaluation (Signed)
  Anesthesia Post-op Note  Patient: [name]  Procedure(s) Performed: Procedure(s): POST PARTUM TUBAL LIGATION (Bilateral)  Patient Location: PACU and Mother/Baby  Anesthesia Type:Epidural  Level of Consciousness: awake, alert , oriented and patient cooperative  Airway and Oxygen Therapy: Patient Spontanous Breathing  Post-op Pain: none  Post-op Assessment: Post-op Vital signs reviewed, Patient's Cardiovascular Status Stable and Respiratory Function Stable  Post-op Vital Signs: Reviewed and stable  Complications: No apparent anesthesia complications

## 2013-01-02 NOTE — Progress Notes (Signed)
[name] is a 27 y.o. G2P1001 at [redacted]w[redacted]d admitted for active labor  Subjective: Pushing for past hour. Comfortable with epidural. Mom and boyfriend at bedside. Pushing well.   Objective: BP 133/80  Pulse 99  Temp(Src) 98.4 F (36.9 C) (Oral)  Resp 18  Ht 5\' 2"  (1.575 m)  Wt 97.523 kg (215 lb)  BMI 39.31 kg/m2  SpO2 97%  LMP 03/29/2012      FHT:  FHR: 130 bpm, variability: moderate,  accelerations:  Present,  decelerations:  Present some early decels  UC:   regular, every 1-2 minutes SVE:   Dilation: 10 Effacement (%): 100 Station: -1 Exam by:: L Cone, RN and Clinton Gallant, RN  Labs: Lab Results  Component Value Date   WBC 12.6* 01/02/2013   HGB 12.5 01/02/2013   HCT 36.5 01/02/2013   MCV 88.4 01/02/2013   PLT 420* 01/02/2013    Assessment / Plan: Spontaneous labor, progressing normally  Labor: Progressing normally Preeclampsia:  no signs or symptoms of toxicity Fetal Wellbeing:  Category II Pain Control:  Epidural I/D:  n/a Anticipated MOD:  NSVD  Marissa Calamity 01/02/2013, 8:59 AM

## 2013-01-03 ENCOUNTER — Inpatient Hospital Stay (HOSPITAL_COMMUNITY): Admission: RE | Admit: 2013-01-03 | Payer: Medicaid Other | Source: Ambulatory Visit

## 2013-01-03 ENCOUNTER — Encounter (HOSPITAL_COMMUNITY): Payer: Self-pay | Admitting: Obstetrics and Gynecology

## 2013-01-03 DIAGNOSIS — IMO0001 Reserved for inherently not codable concepts without codable children: Secondary | ICD-10-CM

## 2013-01-03 LAB — CBC
Hemoglobin: 10.7 g/dL — ABNORMAL LOW (ref 12.0–15.0)
MCH: 29.6 pg (ref 26.0–34.0)
MCHC: 33.1 g/dL (ref 30.0–36.0)
RDW: 14.1 % (ref 11.5–15.5)
WBC: 13.6 10*3/uL — ABNORMAL HIGH (ref 4.0–10.5)

## 2013-01-03 LAB — HEPATITIS B SURFACE ANTIGEN: Hepatitis B Surface Ag: NEGATIVE

## 2013-01-03 MED ORDER — IBUPROFEN 600 MG PO TABS: 600.0000 mg | ORAL_TABLET | Freq: Four times a day (QID) | ORAL | Status: DC

## 2013-01-03 MED ORDER — OXYCODONE-ACETAMINOPHEN 5-325 MG PO TABS: 1.0000 | ORAL_TABLET | ORAL | Status: DC | PRN

## 2013-01-03 NOTE — Discharge Summary (Signed)
Obstetric Discharge Summary Reason for Admission: onset of labor Prenatal Procedures: NST Intrapartum Procedures: spontaneous vaginal delivery Postpartum Procedures: P.P. tubal ligation Complications-Operative and Postpartum: none Hemoglobin  Date Value Range Status  01/03/2013 10.7* 12.0 - 15.0 g/dL Final     HCT  Date Value Range Status  01/03/2013 32.3* 36.0 - 46.0 % Final  Hospital Course: [name] is a 27 y.o. female presenting for SROM at 0250 and spontaneous onset of contractions. Pt denies LOF and normal Fetal movmenet. Pt in otherwise normal state of health..  History  OB History    Grav  Para  Term  Preterm  Abortions  TAB  SAB  Ect  Mult  Living    2  1  1        1       Past Medical History   Diagnosis  Date   .  Medical history non-contributory    .  Diabetes mellitus without complication    .  Gestational diabetes     Past Surgical History   Procedure  Laterality  Date   .  Orthopedic surgery      Family History: family history includes Cancer in her maternal grandfather and maternal uncle; Diabetes in her maternal grandfather and maternal grandmother; Hypertension in her father.  Social History: reports that she has never smoked. She has never used smokeless tobacco. She reports that drinks alcohol. She reports that she does not use illicit drugs.   Delivery Note  At 9:33 AM a viable female was delivered via Vaginal, Spontaneous Delivery (Presentation: Right Occiput Anterior). APGAR: 9, 9; weight TBD.  Placenta status: Intact, Spontaneous. Cord: 3 vessels with the following complications: None. Cord pH: not sent  Anesthesia: Epidural  Episiotomy: None  Lacerations: 1st degree  Suture Repair: 3.0 vicryl  Est. Blood Loss (mL): 300  Mom to postpartum. Baby to nursery-stable.  [name]  01/02/2013, 9:48 AM   PREOPERATIVE DIAGNOSIS: Undesired fertility  POSTOPERATIVE DIAGNOSIS: Undesired fertility  PROCEDURE: Postpartum Bilateral Tubal Sterilization  using Pomeroy method  ANESTHESIA: Epidural  COMPLICATIONS: None immediate.  ESTIMATED BLOOD LOSS: Less than 20cc.  FLUIDS: 900 cc LR.  URINE OUTPUT: 300 cc of clear urine.  INDICATIONS: 27 y.o. yo G2P2002 with undesired fertility,status post vaginal delivery, desires permanent sterilization. Risks and benefits of procedure discussed with patient including permanence of method, bleeding, infection, injury to surrounding organs and need for additional procedures. Risk failure of 0.5-1% with increased risk of ectopic gestation if pregnancy occurs was also discussed with patient.  FINDINGS: Normal uterus, tubes, and ovaries.  Has done well postop. Pain has been well controlled. Ready for discharge.    Physical Exam:  General: alert, cooperative and no distress Lochia: appropriate Uterine Fundus: firm Incision: healing well, no significant drainage, no dehiscence, no significant erythema DVT Evaluation: No evidence of DVT seen on physical exam.  Discharge Diagnoses: Term Pregnancy-delivered, postpartum Tubal Ligation  Discharge Information: Date: 01/03/2013 Activity: unrestricted and pelvic rest, recommend no driving for 2 weeks Diet: routine Medications: Ibuprofen and Percocet Condition: stable and improved Instructions: refer to practice specific booklet Discharge to: home   Newborn Data: Live born female  Birth Weight: 7 lb 10.1 oz (3460 g) APGAR: 9, 9  Home with mother.  [facility] 01/03/2013, 7:45 AM

## 2013-01-04 LAB — RUBELLA SCREEN: Rubella: 0.38 Index (ref <0.90)

## 2013-01-04 NOTE — Discharge Summary (Signed)
Attestation of Attending Supervision of Advanced Practitioner (CNM/NP): Evaluation and management procedures were performed by the Advanced Practitioner under my supervision and collaboration.  I have reviewed the Advanced Practitioner's note and chart, and I agree with the management and plan.  Lakhia Gengler 01/04/2013 10:09 AM

## 2013-02-07 ENCOUNTER — Encounter: Payer: Self-pay | Admitting: Advanced Practice Midwife

## 2013-02-07 ENCOUNTER — Ambulatory Visit (INDEPENDENT_AMBULATORY_CARE_PROVIDER_SITE_OTHER): Payer: Medicaid Other | Admitting: Advanced Practice Midwife

## 2013-02-07 MED ORDER — FLUOXETINE HCL 20 MG PO CAPS: 20.0000 mg | ORAL_CAPSULE | Freq: Every day | ORAL | Status: DC

## 2013-02-07 NOTE — Progress Notes (Signed)
  [name] is a 27 y.o. who presents for a postpartum visit. She is 5 weeks postpartum following a spontaneous vaginal delivery. I have fully reviewed the prenatal and intrapartum course. The delivery was at 39.6 gestational weeks.  Anesthesia: epidural. Postpartum course has been uneventful. Baby's course has been uneventful. Baby is feeding by bottle. Bleeding: no bleeding. Bowel function is normal. Bladder function is normal. Patient is sexually active. Contraception method is tubal ligation done in hospital. Postpartum depression screening: positive. Denies SI/HI.  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  Good support system    Review of Systems   Constitutional: Negative for fever and chills Eyes: Negative for visual disturbances Respiratory: Negative for shortness of breath, dyspnea Cardiovascular: Negative for chest pain or palpitations  Gastrointestinal: Negative for vomiting, diarrhea and constipation Genitourinary: Negative for dysuria and urgency Musculoskeletal: Negative for back pain, joint pain, myalgias  Neurological: Negative for dizziness and headaches   Objective:     Filed Vitals:   02/07/13 1138  BP: 120/82   General:  alert, cooperative and no distress   Breasts:  negative  Lungs: clear to auscultation bilaterally  Heart:  regular rate and rhythm  Abdomen: Soft, nontender   Vulva:  normal  Vagina: normal vagina  Cervix:  closed  Corpus: Well involuted     Rectal Exam: no hemorrhoids        Assessment:    normal postpartum exam. Postpartum depression Plan:    1. Contraception: tubal ligation 2.Prozac 20mg  qd (successfully took for PPD prior pregnancy) Follow up in: 4 weeks for med check or as needed.  3.  Check FBS for a few weeks.  If all < 90, no f/u

## 2013-02-07 NOTE — Patient Instructions (Signed)
Check a few fasting blood sugars.  If all less than 90, your diabetes is gone!

## 2013-03-08 ENCOUNTER — Ambulatory Visit: Payer: Medicaid Other | Admitting: Advanced Practice Midwife

## 2013-10-27 ENCOUNTER — Emergency Department (HOSPITAL_COMMUNITY): Payer: Medicaid Other

## 2013-10-27 ENCOUNTER — Encounter (HOSPITAL_COMMUNITY): Payer: Self-pay | Admitting: Emergency Medicine

## 2013-10-27 ENCOUNTER — Inpatient Hospital Stay (HOSPITAL_COMMUNITY)
Admission: EM | Admit: 2013-10-27 | Discharge: 2013-10-30 | DRG: 418 | Disposition: A | Discharge status: Home / Self Care | Payer: Medicaid Other | Attending: General Surgery | Admitting: General Surgery

## 2013-10-27 DIAGNOSIS — E119 Type 2 diabetes mellitus without complications: Secondary | ICD-10-CM | POA: Diagnosis present

## 2013-10-27 DIAGNOSIS — K8062 Calculus of gallbladder and bile duct with acute cholecystitis without obstruction: Secondary | ICD-10-CM | POA: Diagnosis present

## 2013-10-27 DIAGNOSIS — K859 Acute pancreatitis without necrosis or infection, unspecified: Principal | ICD-10-CM | POA: Diagnosis present

## 2013-10-27 DIAGNOSIS — R109 Unspecified abdominal pain: Secondary | ICD-10-CM | POA: Diagnosis present

## 2013-10-27 DIAGNOSIS — Z8249 Family history of ischemic heart disease and other diseases of the circulatory system: Secondary | ICD-10-CM

## 2013-10-27 DIAGNOSIS — Z833 Family history of diabetes mellitus: Secondary | ICD-10-CM | POA: Diagnosis not present

## 2013-10-27 DIAGNOSIS — K851 Biliary acute pancreatitis without necrosis or infection: Secondary | ICD-10-CM | POA: Diagnosis present

## 2013-10-27 DIAGNOSIS — K819 Cholecystitis, unspecified: Secondary | ICD-10-CM | POA: Diagnosis present

## 2013-10-27 LAB — COMPREHENSIVE METABOLIC PANEL
ALK PHOS: 114 U/L (ref 39–117)
ALT: 32 U/L (ref 0–35)
ANION GAP: 11 (ref 5–15)
AST: 65 U/L — ABNORMAL HIGH (ref 0–37)
Albumin: 3.8 g/dL (ref 3.5–5.2)
BILIRUBIN TOTAL: 1.1 mg/dL (ref 0.3–1.2)
BUN: 13 mg/dL (ref 6–23)
CO2: 27 meq/L (ref 19–32)
Calcium: 9.4 mg/dL (ref 8.4–10.5)
Chloride: 99 mEq/L (ref 96–112)
Creatinine, Ser: 0.76 mg/dL (ref 0.50–1.10)
GLUCOSE: 160 mg/dL — AB (ref 70–99)
POTASSIUM: 4.8 meq/L (ref 3.7–5.3)
Sodium: 137 mEq/L (ref 137–147)
TOTAL PROTEIN: 7.8 g/dL (ref 6.0–8.3)

## 2013-10-27 LAB — URINALYSIS, ROUTINE W REFLEX MICROSCOPIC
Bilirubin Urine: NEGATIVE
Glucose, UA: NEGATIVE mg/dL
Hgb urine dipstick: NEGATIVE
KETONES UR: NEGATIVE mg/dL
Nitrite: NEGATIVE
PROTEIN: NEGATIVE mg/dL
Specific Gravity, Urine: >1.03 — ABNORMAL HIGH (ref 1.005–1.030)
Urobilinogen, UA: 1 mg/dL (ref 0.0–1.0)
pH: 6 (ref 5.0–8.0)

## 2013-10-27 LAB — CBC WITH DIFFERENTIAL/PLATELET
BASOS PCT: 0 % (ref 0–1)
Basophils Absolute: 0 10*3/uL (ref 0.0–0.1)
EOS ABS: 0 10*3/uL (ref 0.0–0.7)
Eosinophils Relative: 0 % (ref 0–5)
HCT: 41.9 % (ref 36.0–46.0)
Hemoglobin: 14 g/dL (ref 12.0–15.0)
Lymphocytes Relative: 8 % — ABNORMAL LOW (ref 12–46)
Lymphs Abs: 1.3 10*3/uL (ref 0.7–4.0)
MCH: 30.4 pg (ref 26.0–34.0)
MCHC: 33.4 g/dL (ref 30.0–36.0)
MCV: 91.1 fL (ref 78.0–100.0)
Monocytes Absolute: 0.9 10*3/uL (ref 0.1–1.0)
Monocytes Relative: 5 % (ref 3–12)
NEUTROS PCT: 87 % — AB (ref 43–77)
Neutro Abs: 13.8 10*3/uL — ABNORMAL HIGH (ref 1.7–7.7)
PLATELETS: 408 10*3/uL — AB (ref 150–400)
RBC: 4.6 MIL/uL (ref 3.87–5.11)
RDW: 13.5 % (ref 11.5–15.5)
WBC: 16 10*3/uL — ABNORMAL HIGH (ref 4.0–10.5)

## 2013-10-27 LAB — URINE MICROSCOPIC-ADD ON

## 2013-10-27 LAB — LIPASE, BLOOD: Lipase: 86 U/L — ABNORMAL HIGH (ref 11–59)

## 2013-10-27 LAB — PREGNANCY, URINE: PREG TEST UR: NEGATIVE

## 2013-10-27 MED ORDER — ONDANSETRON HCL 4 MG/2ML IJ SOLN
4.0000 mg | Freq: Once | INTRAMUSCULAR | Status: AC
  Administered 2013-10-27: 4 mg via INTRAVENOUS
  Filled 2013-10-27: qty 2

## 2013-10-27 MED ORDER — ACETAMINOPHEN 325 MG PO TABS: 650.0000 mg | ORAL_TABLET | Freq: Four times a day (QID) | ORAL | Status: DC | PRN

## 2013-10-27 MED ORDER — HYDROMORPHONE HCL PF 1 MG/ML IJ SOLN: 1.0000 mg | INTRAMUSCULAR | Status: DC | PRN

## 2013-10-27 MED ORDER — SODIUM CHLORIDE 0.45 % IV SOLN: INTRAVENOUS | Status: DC

## 2013-10-27 MED ORDER — ONDANSETRON HCL 4 MG/2ML IJ SOLN
4.0000 mg | Freq: Four times a day (QID) | INTRAMUSCULAR | Status: DC | PRN
  Administered 2013-10-30: 4 mg via INTRAVENOUS
  Filled 2013-10-27: qty 2

## 2013-10-27 MED ORDER — ACETAMINOPHEN 650 MG RE SUPP: 650.0000 mg | Freq: Four times a day (QID) | RECTAL | Status: DC | PRN

## 2013-10-27 MED ORDER — SODIUM CHLORIDE 0.9 % IV BOLUS (SEPSIS)
500.0000 mL | Freq: Once | INTRAVENOUS | Status: AC
  Administered 2013-10-27: 500 mL via INTRAVENOUS

## 2013-10-27 MED ORDER — HYDROMORPHONE HCL PF 1 MG/ML IJ SOLN
1.0000 mg | INTRAMUSCULAR | Status: DC | PRN
  Administered 2013-10-27 – 2013-10-30 (×2): 1 mg via INTRAVENOUS
  Filled 2013-10-27 (×2): qty 1

## 2013-10-27 MED ORDER — HYDROMORPHONE HCL PF 1 MG/ML IJ SOLN
1.0000 mg | Freq: Once | INTRAMUSCULAR | Status: AC
  Administered 2013-10-27: 1 mg via INTRAVENOUS
  Filled 2013-10-27: qty 1

## 2013-10-27 MED ORDER — ENOXAPARIN SODIUM 40 MG/0.4ML ~~LOC~~ SOLN
40.0000 mg | SUBCUTANEOUS | Status: DC
  Administered 2013-10-27 – 2013-10-28 (×2): 40 mg via SUBCUTANEOUS
  Filled 2013-10-27 (×2): qty 0.4

## 2013-10-27 MED ORDER — DIPHENHYDRAMINE HCL 50 MG/ML IJ SOLN: 12.5000 mg | Freq: Four times a day (QID) | INTRAMUSCULAR | Status: DC | PRN

## 2013-10-27 MED ORDER — OXYCODONE HCL 5 MG PO TABS: 5.0000 mg | ORAL_TABLET | ORAL | Status: DC | PRN

## 2013-10-27 MED ORDER — LACTATED RINGERS IV SOLN
INTRAVENOUS | Status: DC
  Administered 2013-10-27 – 2013-10-30 (×5): via INTRAVENOUS

## 2013-10-27 MED ORDER — ONDANSETRON HCL 4 MG/2ML IJ SOLN: 4.0000 mg | Freq: Three times a day (TID) | INTRAMUSCULAR | Status: DC | PRN

## 2013-10-27 MED ORDER — DIPHENHYDRAMINE HCL 12.5 MG/5ML PO ELIX: 12.5000 mg | ORAL_SOLUTION | Freq: Four times a day (QID) | ORAL | Status: DC | PRN

## 2013-10-27 MED ORDER — SODIUM CHLORIDE 0.9 % IV SOLN
INTRAVENOUS | Status: DC
  Administered 2013-10-27: 16:00:00 via INTRAVENOUS

## 2013-10-27 NOTE — ED Notes (Signed)
Pt c/o upper abd pain radiating around to mid of back.  Reports started today around 1230.  Also c/o n/v, denies diarrhea.

## 2013-10-27 NOTE — ED Provider Notes (Signed)
CSN: [id number]     Arrival date & time 10/27/13  1405 History  This chart was scribed for Vanetta Mulders, MD by Elon Spanner, ED Scribe. This patient was seen in room APA09/APA09 and the patient's care was started at 2:45 PM.    Chief Complaint  Patient presents with  . Abdominal Pain    The history is provided by the patient. No language interpreter was used.    HPI Comments: [name] is a 28 y.o. female who presents to the Emergency Department complaining of constant, waxing and waning epigastric  abdominal pain that began 3 hours ago with radiation to her back.  She states that the severity of the pain is variable with episodes of peak intensity at 10/10 and periods of lower intensity at 7/10. She describes this pain as sharp and stabbing in nature. Patient states that she had a previous episode of similar pain for which she sought treatment but does not recall a diagnosis.  She states that to her knowedge, her gallbladder was never checked in association with her previous episodes of similar pain.  Patient reports associated nausea and vomiting with an unknown number of episodes of emesis.  Patient states she has associated SOB, and HA.  Patient denies diarrhea.  PCP: none   Past Medical History  Diagnosis Date  . Medical history non-contributory   . Diabetes mellitus without complication   . Gestational diabetes    Past Surgical History  Procedure Laterality Date  . Orthopedic surgery    . Tubal ligation Bilateral 01/02/2013    Procedure: POST PARTUM TUBAL LIGATION;  Surgeon: Catalina Antigua, MD;  Location: [facility];  Service: Gynecology;  Laterality: Bilateral;   Family History  Problem Relation Age of Onset  . Hypertension Father   . Cancer Maternal Uncle     lung  . Diabetes Maternal Grandmother   . Diabetes Maternal Grandfather   . Cancer Maternal Grandfather     lung   History  Substance Use Topics  . Smoking status: Never Smoker   . Smokeless tobacco: Never  Used  . Alcohol Use: Yes     Comment: OCC; none since +preg. test.   OB History   Grav Para Term Preterm Abortions TAB SAB Ect Mult Living   2 2 2       2      Review of Systems  Constitutional: Negative for fever and chills.  HENT: Negative for rhinorrhea and sore throat.   Eyes: Negative for visual disturbance.  Respiratory: Positive for shortness of breath. Negative for cough.   Cardiovascular: Negative for chest pain and leg swelling.  Gastrointestinal: Positive for nausea, vomiting and abdominal pain. Negative for diarrhea.  Genitourinary: Negative for dysuria.  Musculoskeletal: Negative for back pain and neck pain.  Skin: Negative for rash.  Neurological: Positive for headaches.  Hematological: Does not bruise/bleed easily.  Psychiatric/Behavioral: Negative for confusion.      Allergies  Shellfish allergy  Home Medications   Prior to Admission medications   Medication Sig Start Date End Date Taking? Authorizing Provider  aspirin EC 81 MG tablet Take 243 mg by mouth daily.   Yes Historical Provider, MD   BP 123/67  Pulse 66  Temp(Src) 97.6 F (36.4 C) (Oral)  Resp 15  SpO2 98%  LMP 09/11/2013 Physical Exam  Nursing note and vitals reviewed. Constitutional: She is oriented to person, place, and time. She appears well-developed and well-nourished. No distress.  HENT:  Head: Normocephalic and atraumatic.  Eyes:  Conjunctivae and EOM are normal.  Neck: Neck supple. No tracheal deviation present.  Cardiovascular: Normal rate, regular rhythm and normal heart sounds.   Pulmonary/Chest: Effort normal and breath sounds normal. No respiratory distress. She has no wheezes. She has no rales.  CTA bilaterally  Abdominal: Soft. Bowel sounds are normal. There is tenderness. There is no guarding.  Tenderness in RUQ and LUQ and epigastric region.  Nontender in lower abdomen.  No gaurding.   Musculoskeletal: Normal range of motion. She exhibits no edema and no tenderness.   Neurological: She is alert and oriented to person, place, and time. No cranial nerve deficit. She exhibits normal muscle tone. Coordination normal.  Skin: Skin is warm and dry.  Psychiatric: She has a normal mood and affect. Her behavior is normal.    ED Course  Procedures (including critical care time)  DIAGNOSTIC STUDIES: Oxygen Saturation is 98% on RA, normal by my interpretation.    COORDINATION OF CARE:  2:53 PM Will order imaging, labs, and IV fluids.  Patient acknowledges and agrees with plan.    Labs Review Labs Reviewed  COMPREHENSIVE METABOLIC PANEL - Abnormal; Notable for the following:    Glucose, Bld 160 (*)    AST 65 (*)    All other components within normal limits  LIPASE, BLOOD - Abnormal; Notable for the following:    Lipase 86 (*)    All other components within normal limits  CBC WITH DIFFERENTIAL - Abnormal; Notable for the following:    WBC 16.0 (*)    Platelets 408 (*)    Neutrophils Relative % 87 (*)    Neutro Abs 13.8 (*)    Lymphocytes Relative 8 (*)    All other components within normal limits  URINALYSIS, ROUTINE W REFLEX MICROSCOPIC - Abnormal; Notable for the following:    Specific Gravity, Urine >1.030 (*)    Leukocytes, UA TRACE (*)    All other components within normal limits  URINE MICROSCOPIC-ADD ON - Abnormal; Notable for the following:    Squamous Epithelial / LPF MANY (*)    Bacteria, UA FEW (*)    All other components within normal limits  PREGNANCY, URINE   Results for orders placed during the hospital encounter of 10/27/13  COMPREHENSIVE METABOLIC PANEL      Result Value Ref Range   Sodium 137  137 - 147 mEq/L   Potassium 4.8  3.7 - 5.3 mEq/L   Chloride 99  96 - 112 mEq/L   CO2 27  19 - 32 mEq/L   Glucose, Bld 160 (*) 70 - 99 mg/dL   BUN 13  6 - 23 mg/dL   Creatinine, Ser 1.61  0.50 - 1.10 mg/dL   Calcium 9.4  8.4 - 09.6 mg/dL   Total Protein 7.8  6.0 - 8.3 g/dL   Albumin 3.8  3.5 - 5.2 g/dL   AST 65 (*) 0 - 37 U/L   ALT  32  0 - 35 U/L   Alkaline Phosphatase 114  39 - 117 U/L   Total Bilirubin 1.1  0.3 - 1.2 mg/dL   GFR calc non Af Amer >90  >90 mL/min   GFR calc Af Amer >90  >90 mL/min   Anion gap 11  5 - 15  LIPASE, BLOOD      Result Value Ref Range   Lipase 86 (*) 11 - 59 U/L  CBC WITH DIFFERENTIAL      Result Value Ref Range   WBC 16.0 (*) 4.0 -  10.5 K/uL   RBC 4.60  3.87 - 5.11 MIL/uL   Hemoglobin 14.0  12.0 - 15.0 g/dL   HCT 69.641.9  29.536.0 - 28.446.0 %   MCV 91.1  78.0 - 100.0 fL   MCH 30.4  26.0 - 34.0 pg   MCHC 33.4  30.0 - 36.0 g/dL   RDW 13.213.5  44.011.5 - 10.215.5 %   Platelets 408 (*) 150 - 400 K/uL   Neutrophils Relative % 87 (*) 43 - 77 %   Neutro Abs 13.8 (*) 1.7 - 7.7 K/uL   Lymphocytes Relative 8 (*) 12 - 46 %   Lymphs Abs 1.3  0.7 - 4.0 K/uL   Monocytes Relative 5  3 - 12 %   Monocytes Absolute 0.9  0.1 - 1.0 K/uL   Eosinophils Relative 0  0 - 5 %   Eosinophils Absolute 0.0  0.0 - 0.7 K/uL   Basophils Relative 0  0 - 1 %   Basophils Absolute 0.0  0.0 - 0.1 K/uL  URINALYSIS, ROUTINE W REFLEX MICROSCOPIC      Result Value Ref Range   Color, Urine YELLOW  YELLOW   APPearance CLEAR  CLEAR   Specific Gravity, Urine >1.030 (*) 1.005 - 1.030   pH 6.0  5.0 - 8.0   Glucose, UA NEGATIVE  NEGATIVE mg/dL   Hgb urine dipstick NEGATIVE  NEGATIVE   Bilirubin Urine NEGATIVE  NEGATIVE   Ketones, ur NEGATIVE  NEGATIVE mg/dL   Protein, ur NEGATIVE  NEGATIVE mg/dL   Urobilinogen, UA 1.0  0.0 - 1.0 mg/dL   Nitrite NEGATIVE  NEGATIVE   Leukocytes, UA TRACE (*) NEGATIVE  PREGNANCY, URINE      Result Value Ref Range   Preg Test, Ur NEGATIVE  NEGATIVE  URINE MICROSCOPIC-ADD ON      Result Value Ref Range   Squamous Epithelial / LPF MANY (*) RARE   WBC, UA 3-6  <3 WBC/hpf   Bacteria, UA FEW (*) RARE     Imaging Review Dg Chest 2 View  10/27/2013   CLINICAL DATA:  Shortness of breath, abdominal pain.  EXAM: CHEST  2 VIEW  COMPARISON:  None.  FINDINGS: The heart size and mediastinal contours are within  normal limits. Both lungs are clear. No pneumothorax or pleural effusion is noted. The visualized skeletal structures are unremarkable.  IMPRESSION: No acute cardiopulmonary abnormality seen.   Electronically Signed   By: Roque LiasJames  Green M.D.   On: 10/27/2013 16:09   [place] Abdomen Complete  10/27/2013   CLINICAL DATA:  Abdominal pain.  EXAM: ULTRASOUND ABDOMEN COMPLETE  COMPARISON:  None.  FINDINGS: Gallbladder:  Numerous shadowing gallstones. Echogenic sludge. Gallbladder wall thickening up to 5-6 mm. No pericholecystic fluid. Positive sonographic Murphy sign according to the ultrasound technologist.  Common bile duct:  Diameter: Approximately 5 mm.  No visible bile duct stone.  Liver:  Normal size and echotexture without focal parenchymal abnormality. Patent portal vein with hepatopetal flow.  IVC:  Patent.  Pancreas:  Normal examination.  Spleen  Spleen:  Normal examination.  Spleen  Right Kidney:  Length: Approximately 10.6 cm. No hydronephrosis. Well-preserved cortex. No shadowing calculi. Normal parenchymal echotexture without focal abnormalities.  Left Kidney:  Length: Approximately 12.1 cm. No hydronephrosis. Well-preserved cortex. No shadowing calculi. Normal parenchymal echotexture without focal abnormalities.  Abdominal aorta:  Normal in caliber throughout its visualized course in the abdomen without evidence of significant atherosclerosis. Maximum diameter 2.0 cm.  Other findings:  None.  IMPRESSION: 1. Cholelithiasis  and gallbladder sludge. Gallbladder wall thickening and positive sonographic Murphy's sign is consistent with acute cholecystitis. 2. Otherwise normal examination.   Electronically Signed   By: Hulan Saas M.D.   On: 10/27/2013 17:20     EKG Interpretation None      MDM   Final diagnoses:  Cholecystitis    Patient's complete labs are below. Patient with onset of upper quadrant abdominal pain epigastric at 12 noon today. Patient is from her symptoms to her pregnancy which was  10 months ago. She delivered 10 months ago. Workup here today consistent with acute cholecystitis based on ultrasound leukocytosis. Patient also has some mild pancreatitis. Bilirubin is normal. Common duct was normal on the ultrasound. Discuss with Dr. Lovell Sheehan general surgery he will med to this service. The temporary admit orders done by me. Patient given a hydromorphone for pain. Subjectively pain is all resolved. Patient still has some tenderness in the right upper quadrant epigastric area. Patient nontoxic no acute distress.  Results for orders placed during the hospital encounter of 10/27/13  COMPREHENSIVE METABOLIC PANEL      Result Value Ref Range   Sodium 137  137 - 147 mEq/L   Potassium 4.8  3.7 - 5.3 mEq/L   Chloride 99  96 - 112 mEq/L   CO2 27  19 - 32 mEq/L   Glucose, Bld 160 (*) 70 - 99 mg/dL   BUN 13  6 - 23 mg/dL   Creatinine, Ser 1.09  0.50 - 1.10 mg/dL   Calcium 9.4  8.4 - 60.4 mg/dL   Total Protein 7.8  6.0 - 8.3 g/dL   Albumin 3.8  3.5 - 5.2 g/dL   AST 65 (*) 0 - 37 U/L   ALT 32  0 - 35 U/L   Alkaline Phosphatase 114  39 - 117 U/L   Total Bilirubin 1.1  0.3 - 1.2 mg/dL   GFR calc non Af Amer >90  >90 mL/min   GFR calc Af Amer >90  >90 mL/min   Anion gap 11  5 - 15  LIPASE, BLOOD      Result Value Ref Range   Lipase 86 (*) 11 - 59 U/L  CBC WITH DIFFERENTIAL      Result Value Ref Range   WBC 16.0 (*) 4.0 - 10.5 K/uL   RBC 4.60  3.87 - 5.11 MIL/uL   Hemoglobin 14.0  12.0 - 15.0 g/dL   HCT 54.0  98.1 - 19.1 %   MCV 91.1  78.0 - 100.0 fL   MCH 30.4  26.0 - 34.0 pg   MCHC 33.4  30.0 - 36.0 g/dL   RDW 47.8  29.5 - 62.1 %   Platelets 408 (*) 150 - 400 K/uL   Neutrophils Relative % 87 (*) 43 - 77 %   Neutro Abs 13.8 (*) 1.7 - 7.7 K/uL   Lymphocytes Relative 8 (*) 12 - 46 %   Lymphs Abs 1.3  0.7 - 4.0 K/uL   Monocytes Relative 5  3 - 12 %   Monocytes Absolute 0.9  0.1 - 1.0 K/uL   Eosinophils Relative 0  0 - 5 %   Eosinophils Absolute 0.0  0.0 - 0.7 K/uL    Basophils Relative 0  0 - 1 %   Basophils Absolute 0.0  0.0 - 0.1 K/uL  URINALYSIS, ROUTINE W REFLEX MICROSCOPIC      Result Value Ref Range   Color, Urine YELLOW  YELLOW   APPearance CLEAR  CLEAR  Specific Gravity, Urine >1.030 (*) 1.005 - 1.030   pH 6.0  5.0 - 8.0   Glucose, UA NEGATIVE  NEGATIVE mg/dL   Hgb urine dipstick NEGATIVE  NEGATIVE   Bilirubin Urine NEGATIVE  NEGATIVE   Ketones, ur NEGATIVE  NEGATIVE mg/dL   Protein, ur NEGATIVE  NEGATIVE mg/dL   Urobilinogen, UA 1.0  0.0 - 1.0 mg/dL   Nitrite NEGATIVE  NEGATIVE   Leukocytes, UA TRACE (*) NEGATIVE  PREGNANCY, URINE      Result Value Ref Range   Preg Test, Ur NEGATIVE  NEGATIVE  URINE MICROSCOPIC-ADD ON      Result Value Ref Range   Squamous Epithelial / LPF MANY (*) RARE   WBC, UA 3-6  <3 WBC/hpf   Bacteria, UA FEW (*) RARE    I personally performed the services described in this documentation, which was scribed in my presence. The recorded information has been reviewed and is accurate.     Vanetta Mulders, MD 10/27/13 1758

## 2013-10-28 LAB — HEPATIC FUNCTION PANEL
ALBUMIN: 3.3 g/dL — AB (ref 3.5–5.2)
ALK PHOS: 124 U/L — AB (ref 39–117)
ALT: 126 U/L — AB (ref 0–35)
AST: 130 U/L — AB (ref 0–37)
BILIRUBIN TOTAL: 0.6 mg/dL (ref 0.3–1.2)
Bilirubin, Direct: <0.2 mg/dL (ref 0.0–0.3)
Total Protein: 6.8 g/dL (ref 6.0–8.3)

## 2013-10-28 LAB — BASIC METABOLIC PANEL
Anion gap: 9 (ref 5–15)
BUN: 11 mg/dL (ref 6–23)
CHLORIDE: 103 meq/L (ref 96–112)
CO2: 26 meq/L (ref 19–32)
Calcium: 8.7 mg/dL (ref 8.4–10.5)
Creatinine, Ser: 0.79 mg/dL (ref 0.50–1.10)
GFR calc Af Amer: >90 mL/min (ref >90)
GFR calc non Af Amer: >90 mL/min (ref >90)
Glucose, Bld: 89 mg/dL (ref 70–99)
POTASSIUM: 4.2 meq/L (ref 3.7–5.3)
Sodium: 138 mEq/L (ref 137–147)

## 2013-10-28 LAB — CBC
HEMATOCRIT: 40.9 % (ref 36.0–46.0)
HEMOGLOBIN: 13.5 g/dL (ref 12.0–15.0)
MCH: 30.2 pg (ref 26.0–34.0)
MCHC: 33 g/dL (ref 30.0–36.0)
MCV: 91.5 fL (ref 78.0–100.0)
Platelets: 395 10*3/uL (ref 150–400)
RBC: 4.47 MIL/uL (ref 3.87–5.11)
RDW: 13.5 % (ref 11.5–15.5)
WBC: 9.1 10*3/uL (ref 4.0–10.5)

## 2013-10-28 LAB — LIPASE, BLOOD: Lipase: 34 U/L (ref 11–59)

## 2013-10-28 LAB — MAGNESIUM: Magnesium: 2.1 mg/dL (ref 1.5–2.5)

## 2013-10-28 LAB — PHOSPHORUS: PHOSPHORUS: 2.9 mg/dL (ref 2.3–4.6)

## 2013-10-28 NOTE — Progress Notes (Signed)
Utilization review Completed Kambryn Dapolito RN BSN   

## 2013-10-28 NOTE — H&P (Signed)
[name] is an 28 y.o. female.   Chief Complaint: Abdominal pain HPI: Patient is a 28 year old white female who is postpartum approximately 10 months who presented with worsening upper abdominal pain. She was found on ultrasound the gallbladder had cholelithiasis. Her lipase was noted be elevated. She was admitted to hospital for further evaluation treatment. She states that she has had this intermittently throughout the pregnancy. She denies any fever, chills, or jaundice.  Past Medical History  Diagnosis Date  . Medical history non-contributory   . Diabetes mellitus without complication   . Gestational diabetes     Past Surgical History  Procedure Laterality Date  . Orthopedic surgery    . Tubal ligation Bilateral 01/02/2013    Procedure: POST PARTUM TUBAL LIGATION;  Surgeon: [name], MD;  Location: [facility];  Service: Gynecology;  Laterality: Bilateral;    Family History  Problem Relation Age of Onset  . Hypertension Father   . Cancer Maternal Uncle     lung  . Diabetes Maternal Grandmother   . Diabetes Maternal Grandfather   . Cancer Maternal Grandfather     lung   Social History:  reports that she has never smoked. She has never used smokeless tobacco. She reports that she drinks alcohol. She reports that she does not use illicit drugs.  Allergies:  Allergies  Allergen Reactions  . Shellfish Allergy Shortness Of Breath    Medications Prior to Admission  Medication Sig Dispense Refill  . aspirin EC 81 MG tablet Take 243 mg by mouth daily.        Results for orders placed during the hospital encounter of 10/27/13 (from the past 48 hour(s))  COMPREHENSIVE METABOLIC PANEL     Status: Abnormal   Collection Time    10/27/13  3:25 PM      Result Value Ref Range   Sodium 137  137 - 147 mEq/L   Potassium 4.8  3.7 - 5.3 mEq/L   Chloride 99  96 - 112 mEq/L   CO2 27  19 - 32 mEq/L   Glucose, Bld 160 (*) 70 - 99 mg/dL   BUN 13  6 - 23 mg/dL   Creatinine, Ser  0.76  0.50 - 1.10 mg/dL   Calcium 9.4  8.4 - 10.5 mg/dL   Total Protein 7.8  6.0 - 8.3 g/dL   Albumin 3.8  3.5 - 5.2 g/dL   AST 65 (*) 0 - 37 U/L   ALT 32  0 - 35 U/L   Alkaline Phosphatase 114  39 - 117 U/L   Total Bilirubin 1.1  0.3 - 1.2 mg/dL   GFR calc non Af Amer >90  >90 mL/min   GFR calc Af Amer >90  >90 mL/min   Comment: (NOTE)     The eGFR has been calculated using the CKD EPI equation.     This calculation has not been validated in all clinical situations.     eGFR's persistently <90 mL/min signify possible Chronic Kidney     Disease.   Anion gap 11  5 - 15  LIPASE, BLOOD     Status: Abnormal   Collection Time    10/27/13  3:25 PM      Result Value Ref Range   Lipase 86 (*) 11 - 59 U/L  CBC WITH DIFFERENTIAL     Status: Abnormal   Collection Time    10/27/13  3:25 PM      Result Value Ref Range   WBC 16.0 (*)  4.0 - 10.5 K/uL   RBC 4.60  3.87 - 5.11 MIL/uL   Hemoglobin 14.0  12.0 - 15.0 g/dL   HCT 41.9  36.0 - 46.0 %   MCV 91.1  78.0 - 100.0 fL   MCH 30.4  26.0 - 34.0 pg   MCHC 33.4  30.0 - 36.0 g/dL   RDW 13.5  11.5 - 15.5 %   Platelets 408 (*) 150 - 400 K/uL   Neutrophils Relative % 87 (*) 43 - 77 %   Neutro Abs 13.8 (*) 1.7 - 7.7 K/uL   Lymphocytes Relative 8 (*) 12 - 46 %   Lymphs Abs 1.3  0.7 - 4.0 K/uL   Monocytes Relative 5  3 - 12 %   Monocytes Absolute 0.9  0.1 - 1.0 K/uL   Eosinophils Relative 0  0 - 5 %   Eosinophils Absolute 0.0  0.0 - 0.7 K/uL   Basophils Relative 0  0 - 1 %   Basophils Absolute 0.0  0.0 - 0.1 K/uL  URINALYSIS, ROUTINE W REFLEX MICROSCOPIC     Status: Abnormal   Collection Time    10/27/13  3:41 PM      Result Value Ref Range   Color, Urine YELLOW  YELLOW   APPearance CLEAR  CLEAR   Specific Gravity, Urine >1.030 (*) 1.005 - 1.030   pH 6.0  5.0 - 8.0   Glucose, UA NEGATIVE  NEGATIVE mg/dL   Hgb urine dipstick NEGATIVE  NEGATIVE   Bilirubin Urine NEGATIVE  NEGATIVE   Ketones, ur NEGATIVE  NEGATIVE mg/dL   Protein, ur  NEGATIVE  NEGATIVE mg/dL   Urobilinogen, UA 1.0  0.0 - 1.0 mg/dL   Nitrite NEGATIVE  NEGATIVE   Leukocytes, UA TRACE (*) NEGATIVE  PREGNANCY, URINE     Status: None   Collection Time    10/27/13  3:41 PM      Result Value Ref Range   Preg Test, Ur NEGATIVE  NEGATIVE   Comment:            THE SENSITIVITY OF THIS     METHODOLOGY IS >20 mIU/mL.  URINE MICROSCOPIC-ADD ON     Status: Abnormal   Collection Time    10/27/13  3:41 PM      Result Value Ref Range   Squamous Epithelial / LPF MANY (*) RARE   WBC, UA 3-6  <3 WBC/hpf   Bacteria, UA FEW (*) RARE  BASIC METABOLIC PANEL     Status: None   Collection Time    10/28/13  6:52 AM      Result Value Ref Range   Sodium 138  137 - 147 mEq/L   Potassium 4.2  3.7 - 5.3 mEq/L   Chloride 103  96 - 112 mEq/L   CO2 26  19 - 32 mEq/L   Glucose, Bld 89  70 - 99 mg/dL   BUN 11  6 - 23 mg/dL   Creatinine, Ser 0.79  0.50 - 1.10 mg/dL   Calcium 8.7  8.4 - 10.5 mg/dL   GFR calc non Af Amer >90  >90 mL/min   GFR calc Af Amer >90  >90 mL/min   Comment: (NOTE)     The eGFR has been calculated using the CKD EPI equation.     This calculation has not been validated in all clinical situations.     eGFR's persistently <90 mL/min signify possible Chronic Kidney     Disease.   Anion gap 9  5 - 15  MAGNESIUM     Status: None   Collection Time    10/28/13  6:52 AM      Result Value Ref Range   Magnesium 2.1  1.5 - 2.5 mg/dL  PHOSPHORUS     Status: None   Collection Time    10/28/13  6:52 AM      Result Value Ref Range   Phosphorus 2.9  2.3 - 4.6 mg/dL  CBC     Status: None   Collection Time    10/28/13  6:52 AM      Result Value Ref Range   WBC 9.1  4.0 - 10.5 K/uL   RBC 4.47  3.87 - 5.11 MIL/uL   Hemoglobin 13.5  12.0 - 15.0 g/dL   HCT 40.9  36.0 - 46.0 %   MCV 91.5  78.0 - 100.0 fL   MCH 30.2  26.0 - 34.0 pg   MCHC 33.0  30.0 - 36.0 g/dL   RDW 13.5  11.5 - 15.5 %   Platelets 395  150 - 400 K/uL  HEPATIC FUNCTION PANEL     Status:  Abnormal   Collection Time    10/28/13  6:52 AM      Result Value Ref Range   Total Protein 6.8  6.0 - 8.3 g/dL   Albumin 3.3 (*) 3.5 - 5.2 g/dL   AST 130 (*) 0 - 37 U/L   ALT 126 (*) 0 - 35 U/L   Alkaline Phosphatase 124 (*) 39 - 117 U/L   Total Bilirubin 0.6  0.3 - 1.2 mg/dL   Bilirubin, Direct <0.2  0.0 - 0.3 mg/dL   Indirect Bilirubin NOT CALCULATED  0.3 - 0.9 mg/dL  LIPASE, BLOOD     Status: None   Collection Time    10/28/13  6:52 AM      Result Value Ref Range   Lipase 34  11 - 59 U/L   Dg Chest 2 View  10/27/2013   CLINICAL DATA:  Shortness of breath, abdominal pain.  EXAM: CHEST  2 VIEW  COMPARISON:  None.  FINDINGS: The heart size and mediastinal contours are within normal limits. Both lungs are clear. No pneumothorax or pleural effusion is noted. The visualized skeletal structures are unremarkable.  IMPRESSION: No acute cardiopulmonary abnormality seen.   Electronically Signed   By: Sabino Dick M.D.   On: 10/27/2013 16:09   US Abdomen Complete  10/27/2013   CLINICAL DATA:  Abdominal pain.  EXAM: ULTRASOUND ABDOMEN COMPLETE  COMPARISON:  None.  FINDINGS: Gallbladder:  Numerous shadowing gallstones. Echogenic sludge. Gallbladder wall thickening up to 5-6 mm. No pericholecystic fluid. Positive sonographic Murphy sign according to the ultrasound technologist.  Common bile duct:  Diameter: Approximately 5 mm.  No visible bile duct stone.  Liver:  Normal size and echotexture without focal parenchymal abnormality. Patent portal vein with hepatopetal flow.  IVC:  Patent.  Pancreas:  Normal examination.  Spleen  Spleen:  Normal examination.  Spleen  Right Kidney:  Length: Approximately 10.6 cm. No hydronephrosis. Well-preserved cortex. No shadowing calculi. Normal parenchymal echotexture without focal abnormalities.  Left Kidney:  Length: Approximately 12.1 cm. No hydronephrosis. Well-preserved cortex. No shadowing calculi. Normal parenchymal echotexture without focal abnormalities.   Abdominal aorta:  Normal in caliber throughout its visualized course in the abdomen without evidence of significant atherosclerosis. Maximum diameter 2.0 cm.  Other findings:  None.  IMPRESSION: 1. Cholelithiasis and gallbladder sludge. Gallbladder wall thickening and positive sonographic Murphy's sign  is consistent with acute cholecystitis. 2. Otherwise normal examination.   Electronically Signed   By: Evangeline Dakin M.D.   On: 10/27/2013 17:20    Review of Systems  Constitutional: Positive for malaise/fatigue.  HENT: Negative.   Eyes: Negative.   Respiratory: Negative.   Cardiovascular: Negative.   Gastrointestinal: Positive for nausea and abdominal pain.  Genitourinary: Negative.   Musculoskeletal: Negative.   Skin: Negative.   Neurological: Negative.   Endo/Heme/Allergies: Negative.     Blood pressure 94/52, pulse 68, temperature 98.7 F (37.1 C), temperature source Oral, resp. rate 20, height _0  (1.549 m), weight 94.938 kg (209 lb 4.8 oz), last menstrual period 09/11/2013, SpO2 100.00%, not currently breastfeeding. Physical Exam  Vitals reviewed. Constitutional: She is oriented to person, place, and time. She appears well-developed and well-nourished.  HENT:  Head: Normocephalic and atraumatic.  Eyes: No scleral icterus.  Neck: Normal range of motion. Neck supple.  Cardiovascular: Normal rate, regular rhythm and normal heart sounds.   Respiratory: Effort normal and breath sounds normal.  GI: Soft. She exhibits no distension. There is tenderness. There is no rebound.  Tender in right upper quadrant to deep palpation.  Neurological: She is alert and oriented to person, place, and time.  Skin: Skin is warm and dry.     Assessment/Plan Impression: Gallstone pancreatitis Plan: Patient will be admitted the hospital for IV hydration and bowel rest. She will need a laparoscopic cholecystectomy prior to discharge. We'll monitor liver enzyme test. She is being temporarily  scheduled for surgery on 10/30/2013. The risks and benefits of the procedure including bleeding, infection, hepatobiliary injury, the possibility of an open procedure were fully explained to the patient, who gave informed consent.  Narciso Stoutenburg A 10/28/2013, 12:29 PM

## 2013-10-29 LAB — HEPATIC FUNCTION PANEL
ALBUMIN: 3.3 g/dL — AB (ref 3.5–5.2)
ALK PHOS: 107 U/L (ref 39–117)
ALT: 85 U/L — AB (ref 0–35)
AST: 47 U/L — ABNORMAL HIGH (ref 0–37)
Bilirubin, Direct: <0.2 mg/dL (ref 0.0–0.3)
TOTAL PROTEIN: 6.6 g/dL (ref 6.0–8.3)
Total Bilirubin: 0.5 mg/dL (ref 0.3–1.2)

## 2013-10-29 LAB — BASIC METABOLIC PANEL
Anion gap: 10 (ref 5–15)
BUN: 8 mg/dL (ref 6–23)
CHLORIDE: 101 meq/L (ref 96–112)
CO2: 27 mEq/L (ref 19–32)
CREATININE: 0.82 mg/dL (ref 0.50–1.10)
Calcium: 8.8 mg/dL (ref 8.4–10.5)
GFR calc Af Amer: >90 mL/min (ref >90)
GFR calc non Af Amer: >90 mL/min (ref >90)
Glucose, Bld: 77 mg/dL (ref 70–99)
Potassium: 4.1 mEq/L (ref 3.7–5.3)
Sodium: 138 mEq/L (ref 137–147)

## 2013-10-29 LAB — CBC
HEMATOCRIT: 40.5 % (ref 36.0–46.0)
Hemoglobin: 13.1 g/dL (ref 12.0–15.0)
MCH: 29.8 pg (ref 26.0–34.0)
MCHC: 32.3 g/dL (ref 30.0–36.0)
MCV: 92 fL (ref 78.0–100.0)
Platelets: 405 10*3/uL — ABNORMAL HIGH (ref 150–400)
RBC: 4.4 MIL/uL (ref 3.87–5.11)
RDW: 13.8 % (ref 11.5–15.5)
WBC: 7.4 10*3/uL (ref 4.0–10.5)

## 2013-10-29 LAB — SURGICAL PCR SCREEN
MRSA, PCR: NEGATIVE
Staphylococcus aureus: POSITIVE — AB

## 2013-10-29 MED ORDER — CHLORHEXIDINE GLUCONATE 4 % EX LIQD
1.0000 "application " | Freq: Once | CUTANEOUS | Status: AC
  Administered 2013-10-29: 1 via TOPICAL
  Filled 2013-10-29: qty 15

## 2013-10-29 NOTE — Progress Notes (Signed)
  Subjective: Abdominal pain has resolved. Tolerating full liquid diet well.  Objective: Vital signs in last 24 hours: Temp:  [98 F (36.7 C)-98.6 F (37 C)] 98.3 F (36.8 C) (07/19 0640) Pulse Rate:  [56-71] 56 (07/19 0640) Resp:  [16-18] 18 (07/19 0640) BP: (98-109)/(48-63) 102/48 mmHg (07/19 0640) SpO2:  [97 %-100 %] 97 % (07/19 0640) Last BM Date: 10/27/13  Intake/Output from previous day: 07/18 0701 - 07/19 0700 In: 2906.7 [P.O.:480; I.V.:2426.7] Out: -  Intake/Output this shift:    General appearance: alert, cooperative and no distress Resp: clear to auscultation bilaterally Cardio: regular rate and rhythm, S1, S2 normal, no murmur, click, rub or gallop GI: soft, non-tender; bowel sounds normal; no masses,  no organomegaly  Lab Results:   Recent Labs  10/28/13 0652 10/29/13 0553  WBC 9.1 7.4  HGB 13.5 13.1  HCT 40.9 40.5  PLT 395 405*   BMET  Recent Labs  10/28/13 0652 10/29/13 0553  NA 138 138  K 4.2 4.1  CL 103 101  CO2 26 27  GLUCOSE 89 77  BUN 11 8  CREATININE 0.79 0.82  CALCIUM 8.7 8.8   PT/INR No results found for this basename: LABPROT, INR,  in the last 72 hours  Studies/Results: Dg Chest 2 View  10/27/2013   CLINICAL DATA:  Shortness of breath, abdominal pain.  EXAM: CHEST  2 VIEW  COMPARISON:  None.  FINDINGS: The heart size and mediastinal contours are within normal limits. Both lungs are clear. No pneumothorax or pleural effusion is noted. The visualized skeletal structures are unremarkable.  IMPRESSION: No acute cardiopulmonary abnormality seen.   Electronically Signed   By: Roque LiasJames  Green M.D.   On: 10/27/2013 16:09   [place] Abdomen Complete  10/27/2013   CLINICAL DATA:  Abdominal pain.  EXAM: ULTRASOUND ABDOMEN COMPLETE  COMPARISON:  None.  FINDINGS: Gallbladder:  Numerous shadowing gallstones. Echogenic sludge. Gallbladder wall thickening up to 5-6 mm. No pericholecystic fluid. Positive sonographic Murphy sign according to the ultrasound  technologist.  Common bile duct:  Diameter: Approximately 5 mm.  No visible bile duct stone.  Liver:  Normal size and echotexture without focal parenchymal abnormality. Patent portal vein with hepatopetal flow.  IVC:  Patent.  Pancreas:  Normal examination.  Spleen  Spleen:  Normal examination.  Spleen  Right Kidney:  Length: Approximately 10.6 cm. No hydronephrosis. Well-preserved cortex. No shadowing calculi. Normal parenchymal echotexture without focal abnormalities.  Left Kidney:  Length: Approximately 12.1 cm. No hydronephrosis. Well-preserved cortex. No shadowing calculi. Normal parenchymal echotexture without focal abnormalities.  Abdominal aorta:  Normal in caliber throughout its visualized course in the abdomen without evidence of significant atherosclerosis. Maximum diameter 2.0 cm.  Other findings:  None.  IMPRESSION: 1. Cholelithiasis and gallbladder sludge. Gallbladder wall thickening and positive sonographic Murphy's sign is consistent with acute cholecystitis. 2. Otherwise normal examination.   Electronically Signed   By: Hulan Saashomas  Lawrence M.D.   On: 10/27/2013 17:20    Anti-infectives: Anti-infectives   None      Assessment/Plan: Impression: Gallstone pancreatitis, resolving. Liver enzyme tests improving. Lipase within normal limits. Plan: We'll proceed with laparoscopic cholecystectomy tomorrow. The risks and benefits of the procedure including bleeding, infection, hepatobiliary injury, the possibility of an open procedure were fully explained to the patient, who gave informed consent.  LOS: 2 days    [name] A 10/29/2013

## 2013-10-30 ENCOUNTER — Encounter (HOSPITAL_COMMUNITY): Payer: Self-pay | Admitting: *Deleted

## 2013-10-30 ENCOUNTER — Encounter (HOSPITAL_COMMUNITY): Payer: Medicaid Other | Admitting: Anesthesiology

## 2013-10-30 ENCOUNTER — Encounter (HOSPITAL_COMMUNITY)
Admission: EM | Disposition: A | Discharge status: Home / Self Care | Payer: Self-pay | Source: Home / Self Care | Attending: General Surgery

## 2013-10-30 ENCOUNTER — Inpatient Hospital Stay (HOSPITAL_COMMUNITY): Payer: Medicaid Other | Admitting: Anesthesiology

## 2013-10-30 HISTORY — PX: CHOLECYSTECTOMY: SHX55

## 2013-10-30 SURGERY — LAPAROSCOPIC CHOLECYSTECTOMY
Anesthesia: General | Site: Abdomen

## 2013-10-30 MED ORDER — POVIDONE-IODINE 10 % OINT PACKET
TOPICAL_OINTMENT | CUTANEOUS | Status: DC | PRN
  Administered 2013-10-30: 1 via TOPICAL

## 2013-10-30 MED ORDER — FENTANYL CITRATE 0.05 MG/ML IJ SOLN
INTRAMUSCULAR | Status: DC | PRN
  Administered 2013-10-30 (×5): 50 ug via INTRAVENOUS

## 2013-10-30 MED ORDER — MIDAZOLAM HCL 2 MG/2ML IJ SOLN
INTRAMUSCULAR | Status: AC
Start: 2013-10-30 — End: 2013-10-30
  Filled 2013-10-30: qty 2

## 2013-10-30 MED ORDER — MUPIROCIN 2 % EX OINT
1.0000 | TOPICAL_OINTMENT | Freq: Two times a day (BID) | CUTANEOUS | Status: DC
Start: 2013-10-30 — End: 2013-10-30
  Administered 2013-10-30: 1 via NASAL
  Filled 2013-10-30: qty 22

## 2013-10-30 MED ORDER — BUPIVACAINE HCL (PF) 0.5 % IJ SOLN
INTRAMUSCULAR | Status: DC | PRN
  Administered 2013-10-30: 10 mL

## 2013-10-30 MED ORDER — SODIUM CHLORIDE 0.9 % IR SOLN
Status: DC | PRN
  Administered 2013-10-30: 1000 mL

## 2013-10-30 MED ORDER — POVIDONE-IODINE 10 % EX OINT
TOPICAL_OINTMENT | CUTANEOUS | Status: AC
  Filled 2013-10-30: qty 2

## 2013-10-30 MED ORDER — FENTANYL CITRATE 0.05 MG/ML IJ SOLN
25.0000 ug | INTRAMUSCULAR | Status: DC | PRN
  Administered 2013-10-30 (×2): 50 ug via INTRAVENOUS
  Filled 2013-10-30: qty 2

## 2013-10-30 MED ORDER — KETOROLAC TROMETHAMINE 30 MG/ML IJ SOLN
30.0000 mg | Freq: Once | INTRAMUSCULAR | Status: AC
  Administered 2013-10-30: 30 mg via INTRAVENOUS
  Filled 2013-10-30: qty 1

## 2013-10-30 MED ORDER — MIDAZOLAM HCL 2 MG/2ML IJ SOLN
1.0000 mg | INTRAMUSCULAR | Status: DC | PRN
  Administered 2013-10-30 (×2): 2 mg via INTRAVENOUS
  Filled 2013-10-30: qty 2

## 2013-10-30 MED ORDER — LACTATED RINGERS IV SOLN
INTRAVENOUS | Status: DC
  Administered 2013-10-30: 500 mL via INTRAVENOUS
  Administered 2013-10-30: 14:00:00 via INTRAVENOUS

## 2013-10-30 MED ORDER — ONDANSETRON HCL 4 MG/2ML IJ SOLN
4.0000 mg | Freq: Once | INTRAMUSCULAR | Status: AC | PRN
  Administered 2013-10-30: 4 mg via INTRAVENOUS
  Filled 2013-10-30: qty 2

## 2013-10-30 MED ORDER — PROPOFOL 10 MG/ML IV BOLUS
INTRAVENOUS | Status: AC
  Filled 2013-10-30: qty 20

## 2013-10-30 MED ORDER — PROPOFOL 10 MG/ML IV BOLUS
INTRAVENOUS | Status: DC | PRN
  Administered 2013-10-30: 150 mg via INTRAVENOUS

## 2013-10-30 MED ORDER — SUCCINYLCHOLINE CHLORIDE 20 MG/ML IJ SOLN
INTRAMUSCULAR | Status: AC
  Filled 2013-10-30: qty 1

## 2013-10-30 MED ORDER — DEXAMETHASONE SODIUM PHOSPHATE 4 MG/ML IJ SOLN
4.0000 mg | Freq: Once | INTRAMUSCULAR | Status: AC
  Administered 2013-10-30: 4 mg via INTRAVENOUS

## 2013-10-30 MED ORDER — GLYCOPYRROLATE 0.2 MG/ML IJ SOLN
INTRAMUSCULAR | Status: DC | PRN
  Administered 2013-10-30: .5 mg via INTRAVENOUS

## 2013-10-30 MED ORDER — NEOSTIGMINE METHYLSULFATE 10 MG/10ML IV SOLN
INTRAVENOUS | Status: DC | PRN
  Administered 2013-10-30: 3 mg via INTRAVENOUS

## 2013-10-30 MED ORDER — FENTANYL CITRATE 0.05 MG/ML IJ SOLN
INTRAMUSCULAR | Status: AC
  Filled 2013-10-30: qty 5

## 2013-10-30 MED ORDER — DEXAMETHASONE SODIUM PHOSPHATE 4 MG/ML IJ SOLN
INTRAMUSCULAR | Status: AC
  Filled 2013-10-30: qty 1

## 2013-10-30 MED ORDER — ROCURONIUM BROMIDE 50 MG/5ML IV SOLN
INTRAVENOUS | Status: AC
  Filled 2013-10-30: qty 1

## 2013-10-30 MED ORDER — LIDOCAINE HCL 1 % IJ SOLN
INTRAMUSCULAR | Status: DC | PRN
  Administered 2013-10-30: 30 mg via INTRADERMAL

## 2013-10-30 MED ORDER — BUPIVACAINE HCL (PF) 0.5 % IJ SOLN
INTRAMUSCULAR | Status: AC
  Filled 2013-10-30: qty 30

## 2013-10-30 MED ORDER — ENOXAPARIN SODIUM 40 MG/0.4ML ~~LOC~~ SOLN: 40.0000 mg | SUBCUTANEOUS | Status: DC

## 2013-10-30 MED ORDER — LIDOCAINE HCL (PF) 1 % IJ SOLN
INTRAMUSCULAR | Status: AC
  Filled 2013-10-30: qty 5

## 2013-10-30 MED ORDER — ROCURONIUM BROMIDE 100 MG/10ML IV SOLN
INTRAVENOUS | Status: DC | PRN
  Administered 2013-10-30: 30 mg via INTRAVENOUS

## 2013-10-30 MED ORDER — ONDANSETRON HCL 4 MG/2ML IJ SOLN
4.0000 mg | Freq: Once | INTRAMUSCULAR | Status: AC
  Administered 2013-10-30: 4 mg via INTRAVENOUS

## 2013-10-30 MED ORDER — DEXTROSE 5 % IV SOLN
2.0000 g | INTRAVENOUS | Status: AC
  Administered 2013-10-30: 2 g via INTRAVENOUS

## 2013-10-30 MED ORDER — OXYCODONE-ACETAMINOPHEN 7.5-325 MG PO TABS: 1.0000 | ORAL_TABLET | ORAL | Status: DC | PRN

## 2013-10-30 MED ORDER — DEXTROSE 5 % IV SOLN
INTRAVENOUS | Status: AC
  Filled 2013-10-30: qty 2

## 2013-10-30 MED ORDER — CHLORHEXIDINE GLUCONATE CLOTH 2 % EX PADS
6.0000 | MEDICATED_PAD | Freq: Every day | CUTANEOUS | Status: DC
  Administered 2013-10-30: 6 via TOPICAL

## 2013-10-30 MED ORDER — HEMOSTATIC AGENTS (NO CHARGE) OPTIME
TOPICAL | Status: DC | PRN
  Administered 2013-10-30: 1 via TOPICAL

## 2013-10-30 MED ORDER — ONDANSETRON HCL 4 MG/2ML IJ SOLN
INTRAMUSCULAR | Status: AC
  Filled 2013-10-30: qty 2

## 2013-10-30 SURGICAL SUPPLY — 42 items
APPLIER CLIP LAPSCP 10X32 DD (CLIP) ×3 IMPLANT
BAG HAMPER (MISCELLANEOUS) ×3 IMPLANT
BLADE 11 SAFETY STRL DISP (BLADE) ×3 IMPLANT
CLOTH BEACON ORANGE TIMEOUT ST (SAFETY) ×3 IMPLANT
COVER LIGHT HANDLE STERIS (MISCELLANEOUS) ×6 IMPLANT
DECANTER SPIKE VIAL GLASS SM (MISCELLANEOUS) ×3 IMPLANT
DURAPREP 26ML APPLICATOR (WOUND CARE) ×3 IMPLANT
ELECT REM PT RETURN 9FT ADLT (ELECTROSURGICAL) ×3
ELECTRODE REM PT RTRN 9FT ADLT (ELECTROSURGICAL) ×1 IMPLANT
FILTER SMOKE EVAC LAPAROSHD (FILTER) ×3 IMPLANT
FORMALIN 10 PREFIL 120ML (MISCELLANEOUS) ×3 IMPLANT
GLOVE BIOGEL M 6.5 STRL (GLOVE) ×3 IMPLANT
GLOVE BIOGEL PI IND STRL 7.0 (GLOVE) ×1 IMPLANT
GLOVE BIOGEL PI INDICATOR 7.0 (GLOVE) ×2
GLOVE INDICATOR 7.0 STRL GRN (GLOVE) ×3 IMPLANT
GLOVE INDICATOR 7.5 STRL GRN (GLOVE) ×3 IMPLANT
GLOVE SURG SS PI 7.5 STRL IVOR (GLOVE) ×3 IMPLANT
GOWN STRL REUS W/TWL LRG LVL3 (GOWN DISPOSABLE) ×9 IMPLANT
HEMOSTAT SNOW SURGICEL 2X4 (HEMOSTASIS) ×3 IMPLANT
INST SET LAPROSCOPIC AP (KITS) ×3 IMPLANT
IV NS IRRIG 3000ML ARTHROMATIC (IV SOLUTION) IMPLANT
KIT ROOM TURNOVER APOR (KITS) ×3 IMPLANT
MANIFOLD NEPTUNE II (INSTRUMENTS) ×3 IMPLANT
NEEDLE INSUFFLATION 14GA 120MM (NEEDLE) ×3 IMPLANT
NS IRRIG 1000ML POUR BTL (IV SOLUTION) ×3 IMPLANT
PACK LAP CHOLE LZT030E (CUSTOM PROCEDURE TRAY) ×3 IMPLANT
PAD ARMBOARD 7.5X6 YLW CONV (MISCELLANEOUS) ×3 IMPLANT
POUCH SPECIMEN RETRIEVAL 10MM (ENDOMECHANICALS) ×3 IMPLANT
SET BASIN LINEN APH (SET/KITS/TRAYS/PACK) ×3 IMPLANT
SET TUBE IRRIG SUCTION NO TIP (IRRIGATION / IRRIGATOR) IMPLANT
SLEEVE ENDOPATH XCEL 5M (ENDOMECHANICALS) ×3 IMPLANT
SPONGE GAUZE 2X2 8PLY STER LF (GAUZE/BANDAGES/DRESSINGS) ×4
SPONGE GAUZE 2X2 8PLY STRL LF (GAUZE/BANDAGES/DRESSINGS) ×8 IMPLANT
STAPLER VISISTAT (STAPLE) ×3 IMPLANT
SUT VICRYL 0 UR6 27IN ABS (SUTURE) ×3 IMPLANT
TAPE CLOTH SURG 4X10 WHT LF (GAUZE/BANDAGES/DRESSINGS) ×3 IMPLANT
TROCAR ENDO BLADELESS 11MM (ENDOMECHANICALS) ×3 IMPLANT
TROCAR XCEL NON-BLD 5MMX100MML (ENDOMECHANICALS) ×3 IMPLANT
TROCAR XCEL UNIV SLVE 11M 100M (ENDOMECHANICALS) ×3 IMPLANT
TUBING INSUFFLATION (TUBING) ×3 IMPLANT
WARMER LAPAROSCOPE (MISCELLANEOUS) ×3 IMPLANT
YANKAUER SUCT 12FT TUBE ARGYLE (SUCTIONS) ×3 IMPLANT

## 2013-10-30 NOTE — Progress Notes (Signed)
Patient transported pack to room 310 after having surgery. Patient was alert and oriented. Patients VS were within normal limits and patients only complaint was pain. Patient received pain medication @1630 . Encouraged patient to ambulate. Will continue to monitor patient.

## 2013-10-30 NOTE — Anesthesia Preprocedure Evaluation (Signed)
Anesthesia Evaluation  Patient identified by MRN, date of birth, ID band Patient awake    Reviewed: Allergy & Precautions, H&P , NPO status , Patient's Chart, lab work & pertinent test results  Airway Mallampati: II TM Distance: >3 FB Neck ROM: full  Mouth opening: Limited Mouth Opening  Dental  (+) Teeth Intact   Pulmonary neg pulmonary ROS,  breath sounds clear to auscultation        Cardiovascular - CABG negative cardio ROS  Rhythm:regular Rate:Normal     Neuro/Psych    GI/Hepatic negative GI ROS, Pancreatitis    Endo/Other  diabetes, GestationalMorbid obesity  Renal/GU      Musculoskeletal   Abdominal   Peds  Hematology   Anesthesia Other Findings       Reproductive/Obstetrics                           Anesthesia Physical Anesthesia Plan  ASA: II  Anesthesia Plan: General   Post-op Pain Management:    Induction: Intravenous  Airway Management Planned: Oral ETT  Additional Equipment:   Intra-op Plan:   Post-operative Plan: Extubation in OR  Informed Consent: I have reviewed the patients History and Physical, chart, labs and discussed the procedure including the risks, benefits and alternatives for the proposed anesthesia with the patient or authorized representative who has indicated his/her understanding and acceptance.     Plan Discussed with:   Anesthesia Plan Comments:         Anesthesia Quick Evaluation

## 2013-10-30 NOTE — Transfer of Care (Signed)
Immediate Anesthesia Transfer of Care Note  Patient: [name]  Procedure(s) Performed: Procedure(s): LAPAROSCOPIC CHOLECYSTECTOMY (N/A)  Patient Location: PACU  Anesthesia Type:General  Level of Consciousness: awake, alert  and oriented  Airway & Oxygen Therapy: Patient Spontanous Breathing  Post-op Assessment: Report given to PACU RN  Post vital signs: Reviewed  Complications: No apparent anesthesia complications

## 2013-10-30 NOTE — Discharge Instructions (Signed)

## 2013-10-30 NOTE — Progress Notes (Signed)
Nurse in room going through discharge instructions and teaching regarding post procedure. Pt denies questions. Pt being escorted by wheelchair to main entrance by tech. Family member with pt. Discharge packet given to pt to take home, as well as prescriptions.

## 2013-10-30 NOTE — Anesthesia Postprocedure Evaluation (Signed)
  Anesthesia Post-op Note  Patient: Joann AmorAmber M Mcpeters  Procedure(s) Performed: Procedure(s): LAPAROSCOPIC CHOLECYSTECTOMY (N/A)  Patient Location: PACU  Anesthesia Type:General  Level of Consciousness: awake and alert   Airway and Oxygen Therapy: Patient Spontanous Breathing and Patient connected to face mask oxygen  Post-op Pain: none  Post-op Assessment: Post-op Vital signs reviewed, Patient's Cardiovascular Status Stable, Respiratory Function Stable, Patent Airway and No signs of Nausea or vomiting  Post-op Vital Signs: Reviewed and stable  Last Vitals:  Filed Vitals:   10/30/13 1512  BP: 126/67  Pulse: 87  Temp: 37.1 C  Resp: 16    Complications: No apparent anesthesia complications

## 2013-10-30 NOTE — Op Note (Signed)
Patient:  [name]  DOB:  05-Apr-1986  MRN:  [id number]   Preop Diagnosis:  Cholecystitis, cholelithiasis, pancreatitis  Postop Diagnosis:  Same  Procedure:  Laparoscopic cholecystectomy  Surgeon:  Franky MachoMark Yehya Brendle, M.D.  Anes:  General endotracheal  Indications:  Patient is a 28 year old white female who presents with right upper quadrant abdominal pain. She was found to have cholecystitis, cholelithiasis, as well as pancreatitis. The risks and benefits of the procedure including bleeding, infection, hepatobiliary injury, and the possibility of an open procedure were fully explained to the patient, who gave informed consent.  Procedure note:  Patient is placed the supine position. After induction of general endotracheal anesthesia, the abdomen was prepped and draped using usual sterile technique with DuraPrep. Surgical site confirmation was performed.  A supraumbilical incision was made down to the fascia. A Veress needle was introduced into the abdominal cavity and confirmation of placement was done using the saline drop test. The abdomen was then insufflated to 16 mm mercury pressure. An 11 mm trocar was introduced into the abdominal cavity under direct visualization without difficulty. The patient was placed in reverse Trendelenburg position and additional 11 mm trocar was placed the epigastric region 5 mm trochars were placed the right upper quadrant right flank regions. The liver was inspected and noted within normal limits. The gallbladder was retracted in a dynamic fashion in order to expose the triangle of Calot. The cystic duct was first identified. Its junction to the infundibulum was fully identified. Endoclips were placed proximally and distally on the cystic duct, and the cystic duct was divided. This was likewise done cystic artery. The gallbladder was then freed away from the gallbladder fossa using Bovie electrocautery. The gallbladder was delivered through the epigastric trocar  site using an Endo Catch bag. The gallbladder fossa was inspected and no abnormal bleeding or bile leakage was noted. Surgicel is placed the gallbladder fossa. All fluid and air were then evacuated from the abdominal cavity prior to removal of the trochars.  All wounds were irrigated with normal saline. All wounds were injected with 0.5% Sensorcaine. The supraumbilical fascia was reapproximated using 0 Vicryl interrupted suture. All skin incisions were closed using staples. Betadine ointment and dry sterile dressings were applied.  All tape and needle counts were correct at the end of the procedure. Patient was extubated in the operating room and transferred to PACU in stable condition.  Complications:  None  EBL:  Minimal  Specimen:  Gallbladder

## 2013-11-01 ENCOUNTER — Encounter (HOSPITAL_COMMUNITY): Payer: Self-pay | Admitting: General Surgery

## 2013-11-01 NOTE — Discharge Summary (Signed)
Physician Discharge Summary  Patient ID: [name] MRN: [id number] DOB/AGE: 10-08-1985 28 y.o.  Admit date: 10/27/2013 Discharge date: 10/30/2013 Admission Diagnoses: Gallstone pancreatitis  Discharge Diagnoses: Same Active Problems:   Cholecystitis   Pancreatitis, gallstone   Discharged Condition: good  Hospital Course: Patient is [name] 28 year old white female who was admitted to the hospital with worsening right upper quadrant abdominal pain. She was found to have gallstone pancreatitis. She was in the hospital for further evaluation treatment. She was found to have gallstone pancreatitis. She subsequently underwent [name] laparoscopic cholecystectomy on 10/30/2013. She tolerated the procedure well. She was discharged home later that day in good improving condition.  Treatments: surgery: Laparoscopic cholecystectomy on 10/30/2013  Discharge Exam: Blood pressure 117/74, pulse 68, temperature 98.3 F (36.8 C), temperature source Oral, resp. rate 16, height 5\' 1"  (1.549 m), weight 94.938 kg (209 lb 4.8 oz), last menstrual period 09/11/2013, SpO2 93.00%, not currently breastfeeding. General appearance: alert, cooperative and no distress Resp: clear to auscultation bilaterally Cardio: regular rate and rhythm, S1, S2 normal, no murmur, click, rub or gallop GI: Soft. Dressings dry and intact.  Disposition: 01-Home or Self Care     Medication List         aspirin EC 81 MG tablet  Take 243 mg by mouth daily.     oxyCODONE-acetaminophen 7.5-325 MG per tablet  Commonly known as:  PERCOCET  Take 1-2 tablets by mouth every 4 (four) hours as needed.           Follow-up Information   Follow up with Dalia HeadingJENKINS,Emari Demmer A, MD. Schedule an appointment as soon as possible for [name] visit on 11/07/2013.   Specialty:  General Surgery   Contact information:   [address] [place] [id number] [phone]       Signed: Franky [name],[name] 11/01/2013, 11:40 AM

## 2014-02-12 ENCOUNTER — Encounter (HOSPITAL_COMMUNITY): Payer: Self-pay | Admitting: General Surgery

## 2014-09-16 IMAGING — CR DG CHEST 2V
2 series · 2 of 2 positions shown · non-contrast
Comparison: None.

CLINICAL DATA: Shortness of breath, abdominal pain.

EXAM:
CHEST  2 VIEW

[view not recorded (1 of 2)]
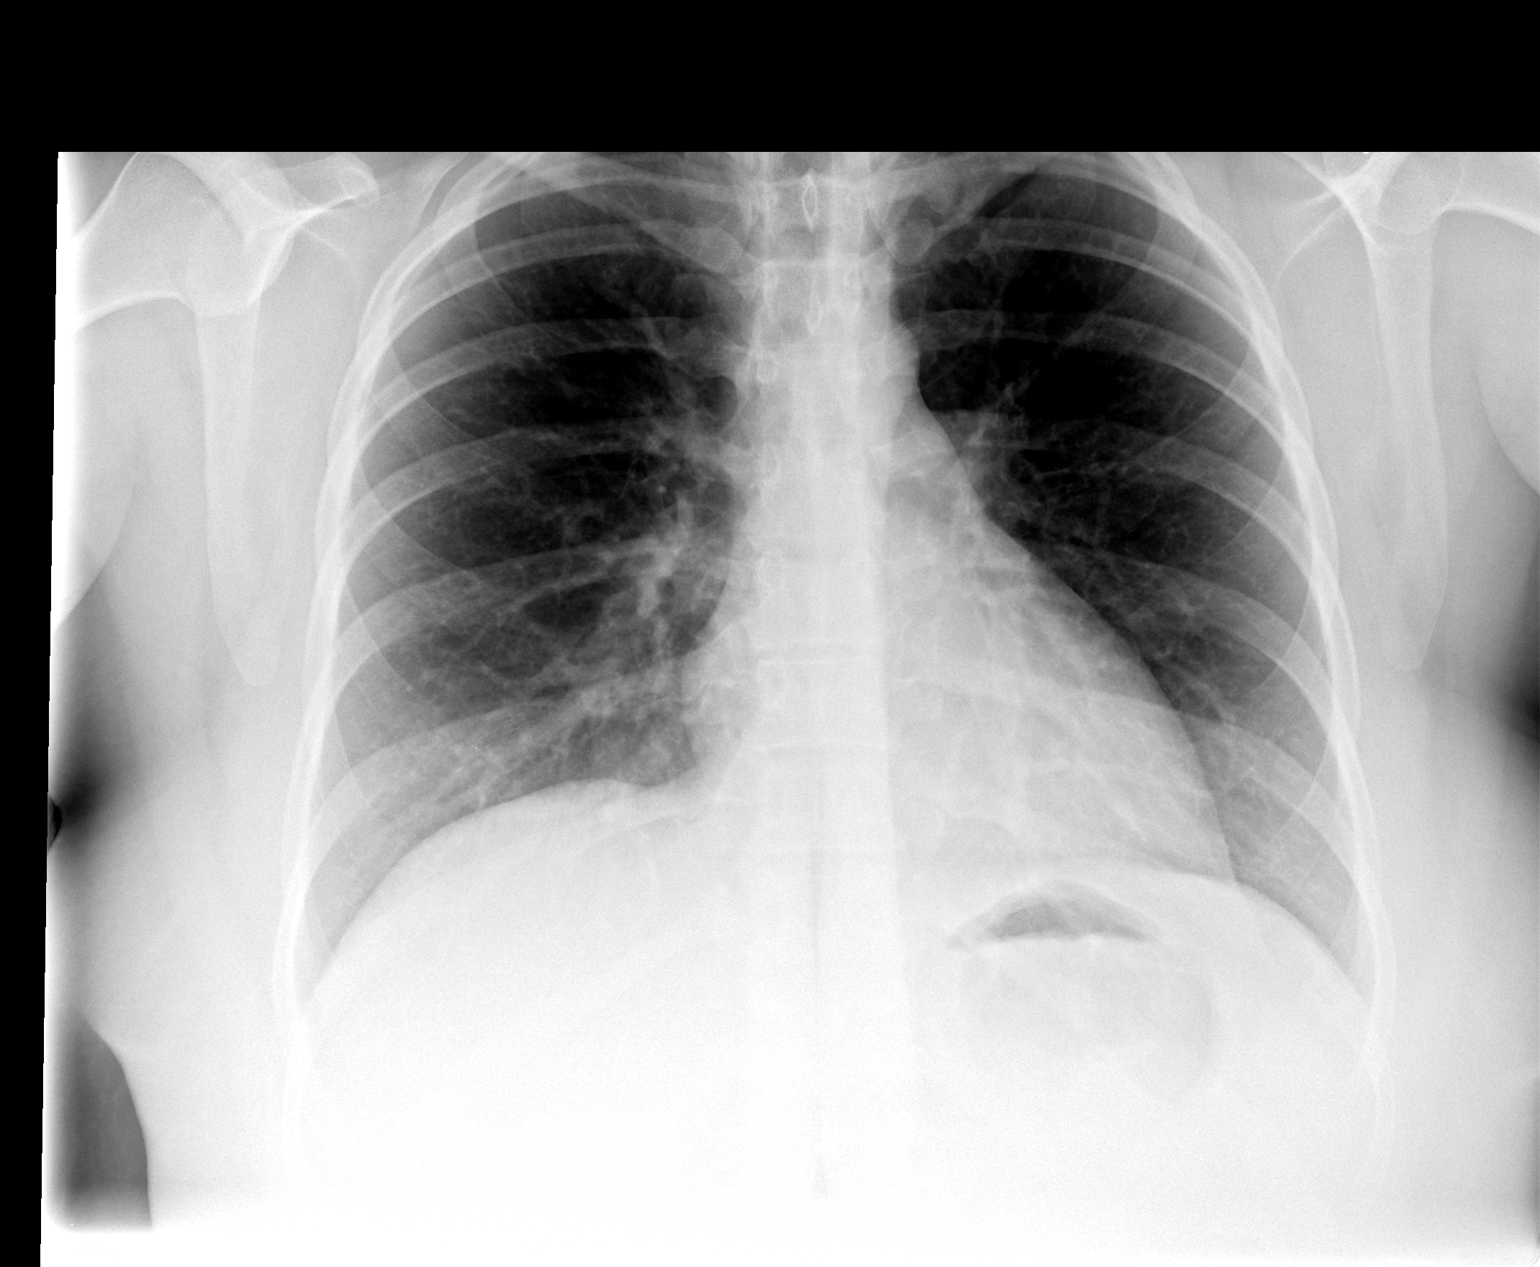

[view not recorded (2 of 2)]
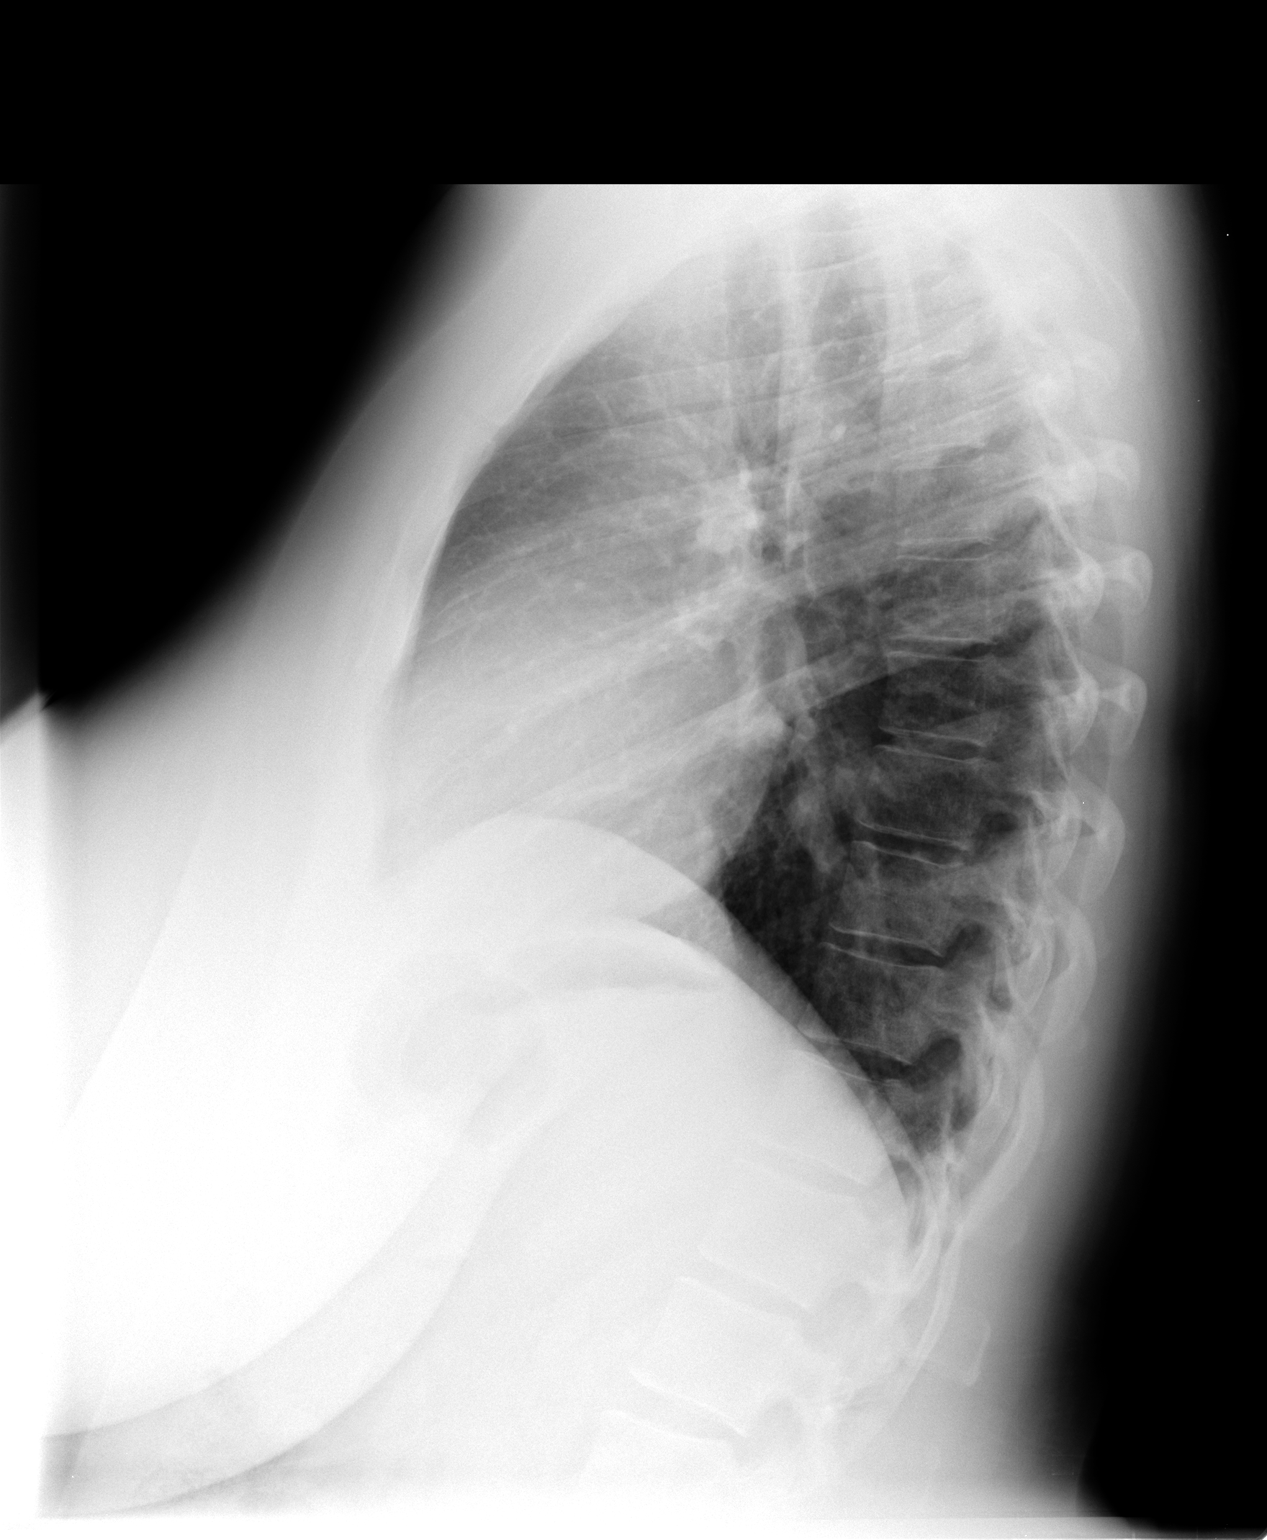

[2 of 2 positions shown; findings below may reference images not displayed]

FINDINGS: The heart size and mediastinal contours are within normal limits.
Both lungs are clear. No pneumothorax or pleural effusion is noted.
The visualized skeletal structures are unremarkable.
IMPRESSION: No acute cardiopulmonary abnormality seen.

## 2014-09-16 IMAGING — US US ABDOMEN COMPLETE
1 series · 13 of 25 positions shown · non-contrast
Comparison: None.

CLINICAL DATA: Abdominal pain.

EXAM:
ULTRASOUND ABDOMEN COMPLETE

[Series 1: us abdomen complete · 0.18mm/px · 13 of 118 slices shown]
[im 1/118]
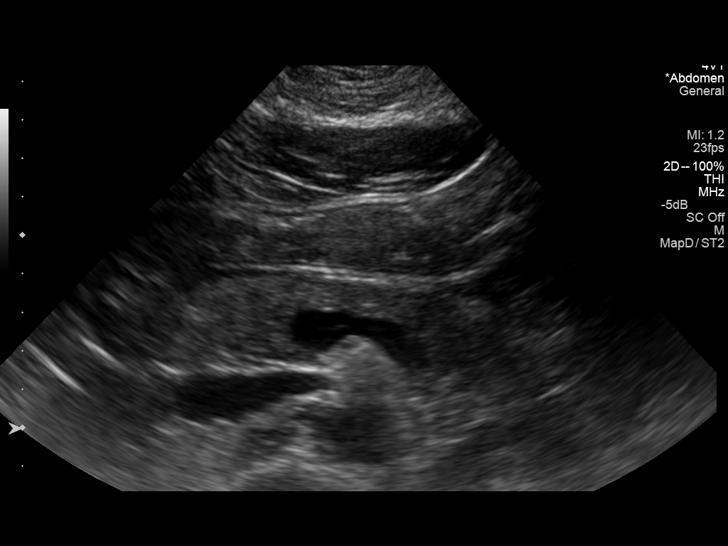
[im 10/118]
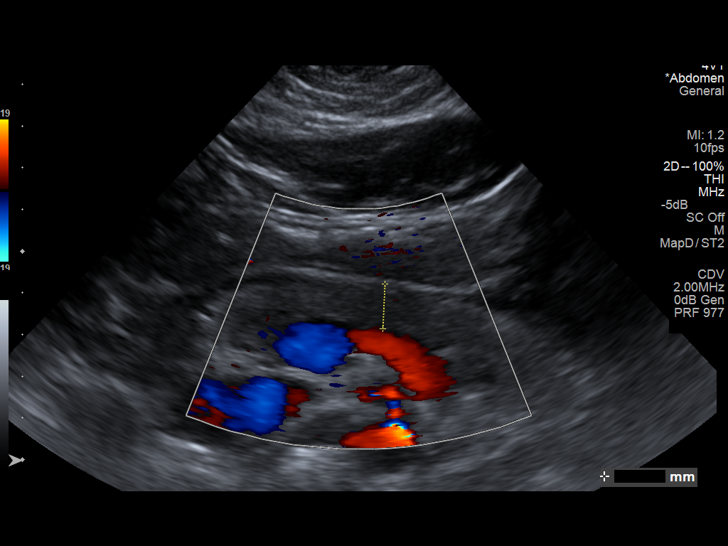
[im 20/118]
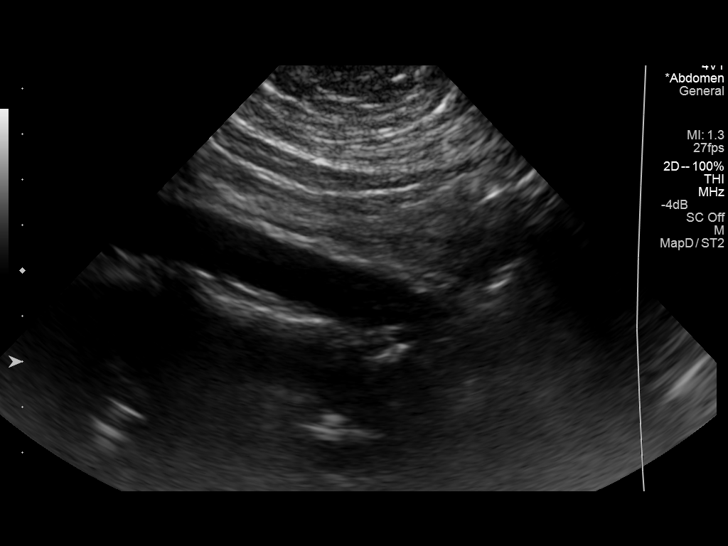
[im 30/118]
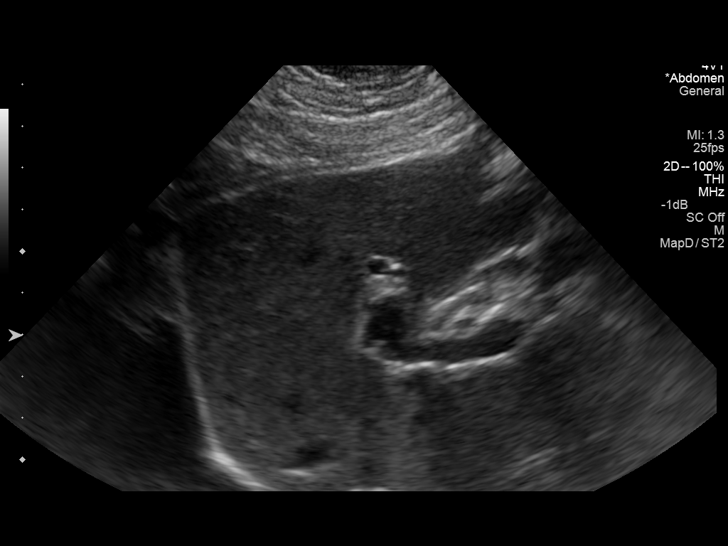
[im 40/118]
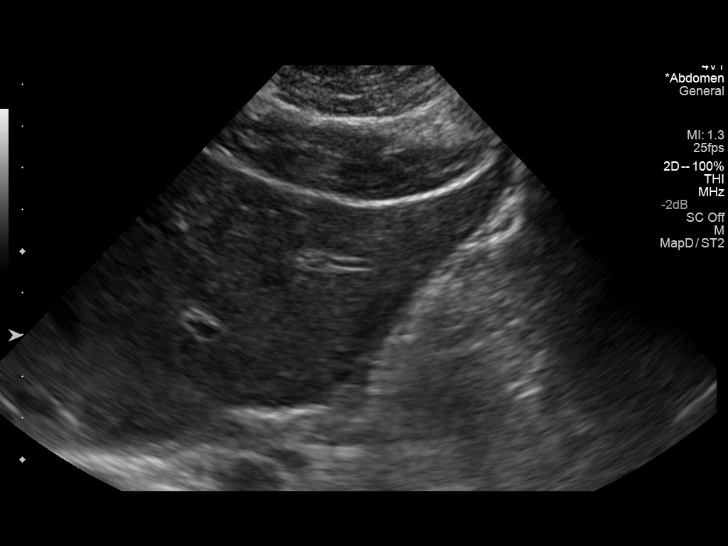
[im 49/118]
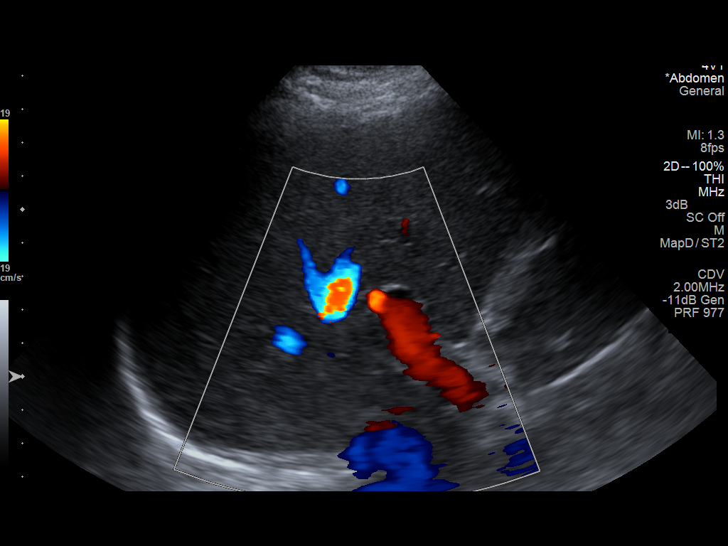
[im 59/118]
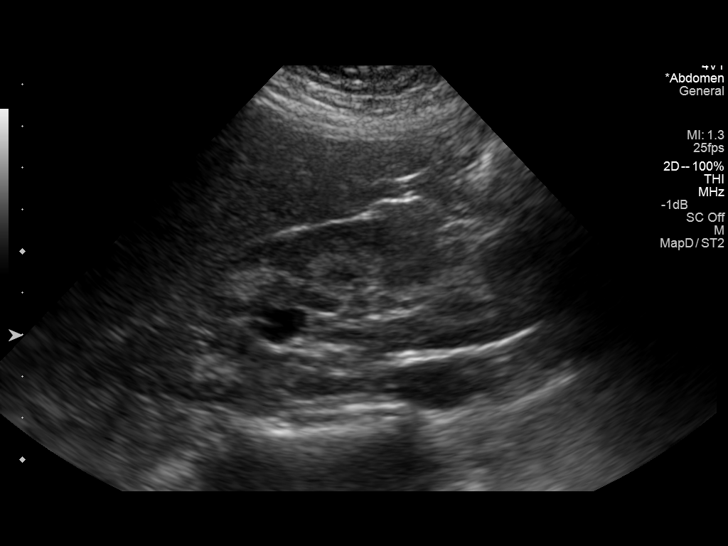
[im 69/118]
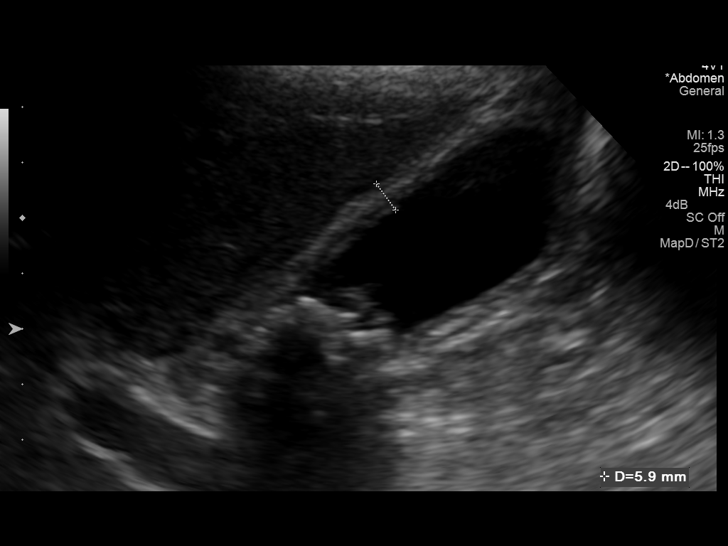
[im 79/118]
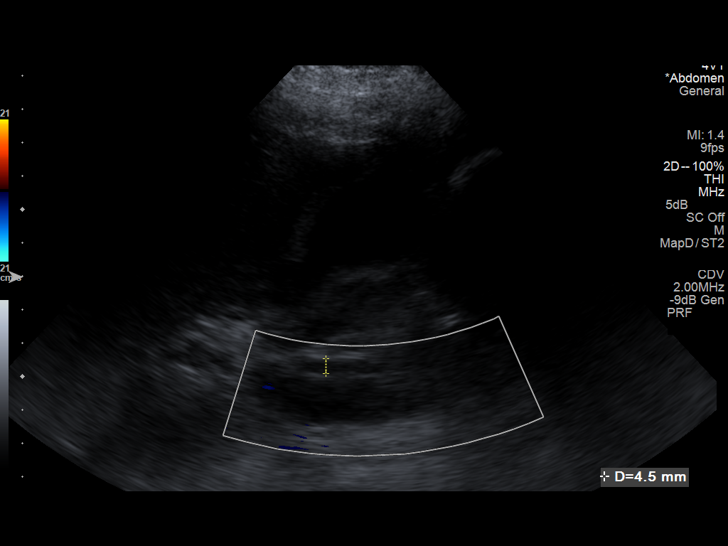
[im 88/118]
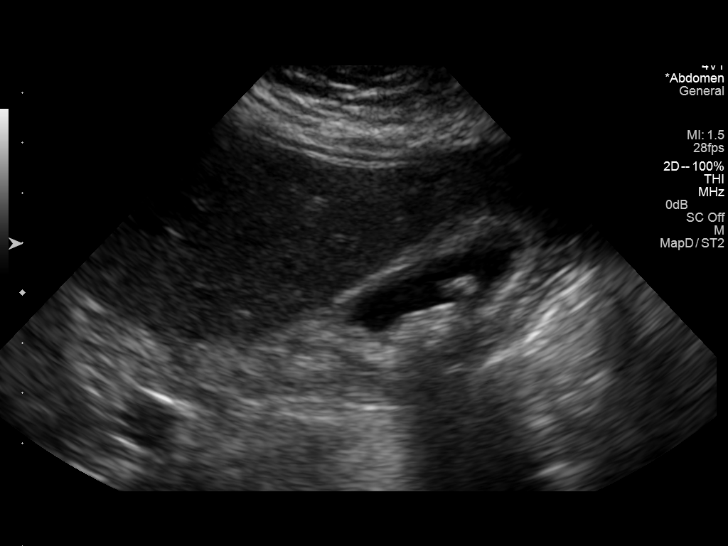
[im 98/118]
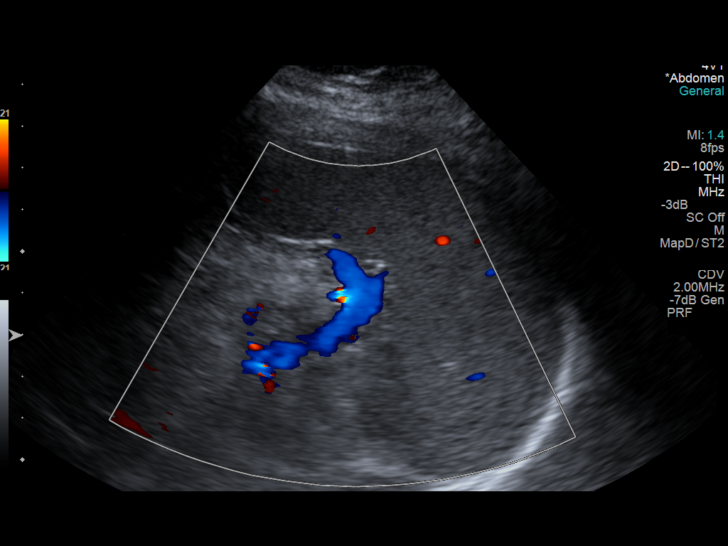
[im 108/118]
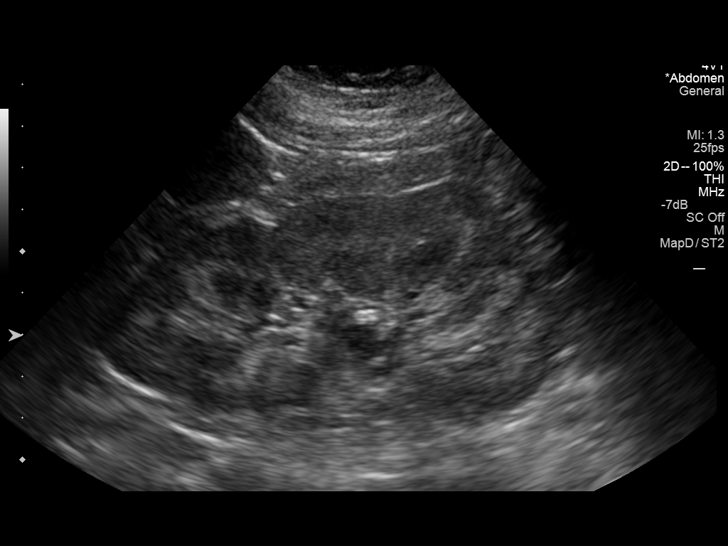
[im 118/118]
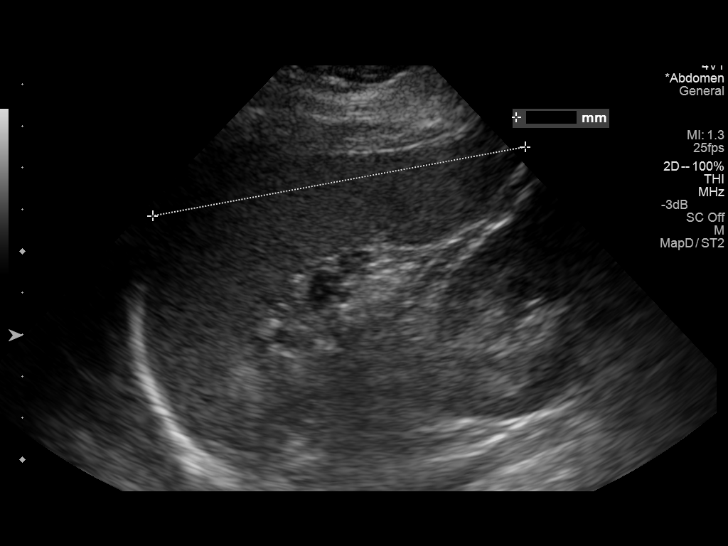

[13 of 25 positions shown; findings below may reference images not displayed]

FINDINGS: Gallbladder:

Numerous shadowing gallstones. Echogenic sludge. Gallbladder wall
thickening up to 5-6 mm. No pericholecystic fluid. Positive
sonographic Murphy sign according to the ultrasound technologist.

Common bile duct:

Diameter: Approximately 5 mm.  No visible bile duct stone.

Liver:

Normal size and echotexture without focal parenchymal abnormality.
Patent portal vein with hepatopetal flow.

IVC:

Patent.

Pancreas:

Normal examination.  Spleen

Spleen:

Normal examination.  Spleen

Right Kidney:

Length: Approximately 10.6 cm. No hydronephrosis. Well-preserved
cortex. No shadowing calculi. Normal parenchymal echotexture without
focal abnormalities.

Left Kidney:

Length: Approximately 12.1 cm. No hydronephrosis. Well-preserved
cortex. No shadowing calculi. Normal parenchymal echotexture without
focal abnormalities.

Abdominal aorta:

Normal in caliber throughout its visualized course in the abdomen
without evidence of significant atherosclerosis. Maximum diameter
2.0 cm.

Other findings:

None.
IMPRESSION: 1. Cholelithiasis and gallbladder sludge. Gallbladder wall
thickening and positive sonographic Murphy's sign is consistent with
acute cholecystitis.
2. Otherwise normal examination.

## 2015-01-09 ENCOUNTER — Encounter: Payer: Self-pay | Admitting: Adult Health

## 2015-01-09 ENCOUNTER — Ambulatory Visit (INDEPENDENT_AMBULATORY_CARE_PROVIDER_SITE_OTHER): Payer: Medicaid Other | Admitting: Adult Health

## 2015-01-09 VITALS — BP 102/62 | HR 92 | Ht 61.0 in | Wt 202.5 lb

## 2015-01-09 DIAGNOSIS — A499 Bacterial infection, unspecified: Secondary | ICD-10-CM | POA: Diagnosis not present

## 2015-01-09 DIAGNOSIS — N939 Abnormal uterine and vaginal bleeding, unspecified: Secondary | ICD-10-CM

## 2015-01-09 DIAGNOSIS — B9689 Other specified bacterial agents as the cause of diseases classified elsewhere: Secondary | ICD-10-CM

## 2015-01-09 DIAGNOSIS — N888 Other specified noninflammatory disorders of cervix uteri: Secondary | ICD-10-CM | POA: Diagnosis not present

## 2015-01-09 DIAGNOSIS — N898 Other specified noninflammatory disorders of vagina: Secondary | ICD-10-CM

## 2015-01-09 DIAGNOSIS — N760 Acute vaginitis: Secondary | ICD-10-CM | POA: Diagnosis not present

## 2015-01-09 DIAGNOSIS — N946 Dysmenorrhea, unspecified: Secondary | ICD-10-CM | POA: Diagnosis not present

## 2015-01-09 DIAGNOSIS — N923 Ovulation bleeding: Secondary | ICD-10-CM | POA: Insufficient documentation

## 2015-01-09 HISTORY — DX: Dysmenorrhea, unspecified: N94.6

## 2015-01-09 HISTORY — DX: Other specified noninflammatory disorders of cervix uteri: N88.8

## 2015-01-09 HISTORY — DX: Ovulation bleeding: N92.3

## 2015-01-09 HISTORY — DX: Other specified noninflammatory disorders of vagina: N89.8

## 2015-01-09 HISTORY — DX: Other specified bacterial agents as the cause of diseases classified elsewhere: N76.0

## 2015-01-09 HISTORY — DX: Other specified bacterial agents as the cause of diseases classified elsewhere: B96.89

## 2015-01-09 LAB — POCT WET PREP (WET MOUNT): WBC, Wet Prep HPF POC: POSITIVE

## 2015-01-09 MED ORDER — IBUPROFEN 800 MG PO TABS: 800.0000 mg | ORAL_TABLET | Freq: Three times a day (TID) | ORAL | Status: DC | PRN

## 2015-01-09 MED ORDER — METRONIDAZOLE 0.75 % VA GEL: VAGINAL | Status: DC

## 2015-01-09 NOTE — Patient Instructions (Signed)
Bacterial Vaginosis Bacterial vaginosis is a vaginal infection that occurs when the normal balance of bacteria in the vagina is disrupted. It results from an overgrowth of certain bacteria. This is the most common vaginal infection in women of childbearing age. Treatment is important to prevent complications, especially in pregnant women, as it can cause a premature delivery. CAUSES  Bacterial vaginosis is caused by an increase in harmful bacteria that are normally present in smaller amounts in the vagina. Several different kinds of bacteria can cause bacterial vaginosis. However, the reason that the condition develops is not fully understood. RISK FACTORS Certain activities or behaviors can put you at an increased risk of developing bacterial vaginosis, including:  Having a new sex partner or multiple sex partners.  Douching.  Using an intrauterine device (IUD) for contraception. Women do not get bacterial vaginosis from toilet seats, bedding, swimming pools, or contact with objects around them. SIGNS AND SYMPTOMS  Some women with bacterial vaginosis have no signs or symptoms. Common symptoms include:  Grey vaginal discharge.  A fishlike odor with discharge, especially after sexual intercourse.  Itching or burning of the vagina and vulva.  Burning or pain with urination. DIAGNOSIS  Your health care provider will take a medical history and examine the vagina for signs of bacterial vaginosis. A sample of vaginal fluid may be taken. Your health care provider will look at this sample under a microscope to check for bacteria and abnormal cells. A vaginal pH test may also be done.  TREATMENT  Bacterial vaginosis may be treated with antibiotic medicines. These may be given in the form of a pill or a vaginal cream. A second round of antibiotics may be prescribed if the condition comes back after treatment.  HOME CARE INSTRUCTIONS   Only take over-the-counter or prescription medicines as  directed by your health care provider.  If antibiotic medicine was prescribed, take it as directed. Make sure you finish it even if you start to feel better.  Do not have sex until treatment is completed.  Tell all sexual partners that you have a vaginal infection. They should see their health care provider and be treated if they have problems, such as a mild rash or itching.  Practice safe sex by using condoms and only having one sex partner. SEEK MEDICAL CARE IF:   Your symptoms are not improving after 3 days of treatment.  You have increased discharge or pain.  You have a fever. MAKE SURE YOU:   Understand these instructions.  Will watch your condition.  Will get help right away if you are not doing well or get worse. FOR MORE INFORMATION  Centers for Disease Control and Prevention, Division of STD Prevention: [employer]za American Sexual Health Association (ASHA): www.ashastd.org  Document Released: 03/30/2005 Document Revised: 01/18/2013 Document Reviewed: 11/09/2012 [facility] Patient Information 2015 [place], [place]. This information is not intended to replace advice given to you by your health care provider. Make sure you discuss any questions you have with your health care provider. Use metrogel at bedtime for 5 nights and no sex Return in 2 week for pap and physical

## 2015-01-09 NOTE — Progress Notes (Signed)
Subjective:     Patient ID: [name], female   DOB: 16-Jun-1985, 29 y.o.   MRN: [id number]  HPI [name] is a 29 year old white female in complaining of bleeding 3 x in September, and  period cramps,also sex hurts at times.She is sp tubal.had spotting 9/2 for 1/2 a day then bleed 5 days 9/10 then spotted 1.5 days 9/26.  Review of Systems Patient denies any headaches, hearing loss, fatigue, blurred vision, shortness of breath, chest pain,  problems with bowel movements, urination.  No joint pain or mood swings.See HPI for positives. Reviewed past medical,surgical, social and family history. Reviewed medications and allergies.     Objective:   Physical Exam BP 102/62 mmHg  Pulse 92  Ht  (1.549 m)  Wt 202 lb 8 oz (91.853 kg)  BMI 38.28 kg/m2  LMP 12/22/2014 (Approximate) Skin warm and dry.Pelvic: external genitalia is normal in appearance no lesions, vagina: white discharge with slight odor,urethra has no lesions or masses noted, cervix:smooth and bulbous,friable with Q tip. uterus: normal size, shape and contour, mildly tender, no masses felt, adnexa: no masses or tenderness noted. Bladder is non tender and no masses felt. Wet prep: + for clue cells and +WBCs. GC/CHL obtained.     Assessment:     Vaginal bleeding between periods Vaginal discharge Friable cervix BV Period cramps    Plan:     Rx metrogel use 1 applicator in vagina at HS x 5 Rx motrin 800 mg #60 1 every 8 hours prn with 1 refill GC/CHL sent Return in 2 weeks for pap and physical Review handout on BV

## 2015-01-10 LAB — GC/CHLAMYDIA PROBE AMP
Chlamydia trachomatis, NAA: NEGATIVE
Neisseria gonorrhoeae by PCR: NEGATIVE

## 2015-01-23 ENCOUNTER — Other Ambulatory Visit: Payer: Medicaid Other | Admitting: Adult Health

## 2015-02-03 ENCOUNTER — Emergency Department (HOSPITAL_COMMUNITY)
Admission: EM | Admit: 2015-02-03 | Discharge: 2015-02-03 | Disposition: A | Discharge status: Home / Self Care | Payer: Medicaid Other | Attending: Emergency Medicine | Admitting: Emergency Medicine

## 2015-02-03 ENCOUNTER — Encounter (HOSPITAL_COMMUNITY): Payer: Self-pay | Admitting: Emergency Medicine

## 2015-02-03 DIAGNOSIS — Z8632 Personal history of gestational diabetes: Secondary | ICD-10-CM | POA: Diagnosis not present

## 2015-02-03 DIAGNOSIS — R21 Rash and other nonspecific skin eruption: Secondary | ICD-10-CM | POA: Diagnosis present

## 2015-02-03 DIAGNOSIS — E119 Type 2 diabetes mellitus without complications: Secondary | ICD-10-CM | POA: Insufficient documentation

## 2015-02-03 DIAGNOSIS — Z8619 Personal history of other infectious and parasitic diseases: Secondary | ICD-10-CM | POA: Insufficient documentation

## 2015-02-03 DIAGNOSIS — L259 Unspecified contact dermatitis, unspecified cause: Secondary | ICD-10-CM | POA: Insufficient documentation

## 2015-02-03 DIAGNOSIS — Z8742 Personal history of other diseases of the female genital tract: Secondary | ICD-10-CM | POA: Diagnosis not present

## 2015-02-03 MED ORDER — DIPHENHYDRAMINE HCL 25 MG PO CAPS
25.0000 mg | ORAL_CAPSULE | Freq: Once | ORAL | Status: AC
  Administered 2015-02-03: 25 mg via ORAL
  Filled 2015-02-03: qty 1

## 2015-02-03 MED ORDER — PREDNISONE 10 MG PO TABS: ORAL_TABLET | ORAL | Status: DC

## 2015-02-03 MED ORDER — PREDNISONE 50 MG PO TABS
60.0000 mg | ORAL_TABLET | Freq: Once | ORAL | Status: AC
  Administered 2015-02-03: 60 mg via ORAL
  Filled 2015-02-03 (×2): qty 1

## 2015-02-03 NOTE — ED Notes (Signed)
Patient states she woke with a rash on the left side of her face this morning. Significant rash noted to right cheek, right ear, and into right side of head. Complains of pain and itching in same area.

## 2015-02-03 NOTE — ED Provider Notes (Signed)
CSN: [id number]     Arrival date & time 02/03/15  1137 History  By signing my name below, I, Elon Spanner, attest that this documentation has been prepared under the direction and in the presence of Ceceilia Cephus, PA-C. Electronically Signed: Elon Spanner ED Scribe. 02/03/2015. 12:18 PM.    Chief Complaint  Patient presents with  . Rash   The history is provided by the patient. No language interpreter was used.   HPI Comments: [name] is a 29 y.o. female with hx of chronic iritis (prednisone drops) who presents to the Emergency Department complaining of a red, itching, non-draining,sore rash on the left side of the face onset this morning; pain worsened by cold compress.  She denies plant exposures however, she reports being outside for a brief period of time while someone else was mowing.  She denies other new dermatologic exposures including new cosmetics, lotions, soaps, fabrics, or moldy environment.  She denies SOB, trouble swallowing, eye pain/watering and fever or recent illness,   Past Medical History  Diagnosis Date  . Medical history non-contributory   . Diabetes mellitus without complication (HCC)   . Gestational diabetes   . Vaginal bleeding between periods 01/09/2015  . Vaginal discharge 01/09/2015  . Friable cervix 01/09/2015  . BV (bacterial vaginosis) 01/09/2015  . Menstrual cramps 01/09/2015   Past Surgical History  Procedure Laterality Date  . Orthopedic surgery    . Tubal ligation Bilateral 01/02/2013    Procedure: POST PARTUM TUBAL LIGATION;  Surgeon: Catalina Antigua, MD;  Location: [facility];  Service: Gynecology;  Laterality: Bilateral;  . Cholecystectomy N/A 10/30/2013    Procedure: LAPAROSCOPIC CHOLECYSTECTOMY;  Surgeon: Dalia Heading, MD;  Location: [facility];  Service: General;  Laterality: N/A;   Family History  Problem Relation Age of Onset  . Hypertension Father   . Cancer Maternal Uncle     lung  . Diabetes Maternal Grandmother   . Diabetes Maternal  Grandfather   . Cancer Maternal Grandfather     lung  . Arthritis Mother    Social History  Substance Use Topics  . Smoking status: Never Smoker   . Smokeless tobacco: Never Used  . Alcohol Use: Yes     Comment: OCC   OB History    Gravida Para Term Preterm AB TAB SAB Ectopic Multiple Living   Review of Systems  Constitutional: Negative for fever, chills, activity change and appetite change.  HENT: Negative for facial swelling, sore throat and trouble swallowing.   Respiratory: Negative for chest tightness, shortness of breath and wheezing.   Musculoskeletal: Negative for neck pain and neck stiffness.  Skin: Positive for rash. Negative for wound.  Neurological: Negative for dizziness, weakness, numbness and headaches.  All other systems reviewed and are negative.     Allergies  Shellfish allergy  Home Medications   Prior to Admission medications   Medication Sig Start Date End Date Taking? Authorizing Provider  ibuprofen (ADVIL,MOTRIN) 800 MG tablet Take 1 tablet (800 mg total) by mouth every 8 (eight) hours as needed. 01/09/15   Adline Potter, NP  metroNIDAZOLE (METROGEL VAGINAL) 0.75 % vaginal gel Use 1 applicator in vagina for 5 nights 01/09/15   Adline Potter, NP   BP 125/76 mmHg  Pulse 81  Temp(Src) 97.6 F (36.4 C) (Oral)  Resp 18  Ht  (2.057 m)  Wt 201 lb (91.173 kg)  BMI 21.55  kg/m2  SpO2 95%  LMP 01/20/2015 Physical Exam  Constitutional: She is oriented to person, place, and time. She appears well-developed and well-nourished. No distress.  HENT:  Head: Normocephalic and atraumatic.  Mouth/Throat: Uvula is midline and oropharynx is clear and moist.  No edema  Eyes: Conjunctivae and EOM are normal.  Neck: Neck supple. No tracheal deviation present.  Cardiovascular: Normal rate.   Pulmonary/Chest: Effort normal. No respiratory distress.  Musculoskeletal: Normal range of motion.  Neurological: She is alert and oriented  to person, place, and time.  Skin: Skin is warm and dry.  Erythematous maculopapular rash to the left face and forehead.  Mild edema and few vesicles present.    Psychiatric: She has a normal mood and affect. Her behavior is normal.  Nursing note and vitals reviewed.   ED Course  Procedures (including critical care time)  DIAGNOSTIC STUDIES: Oxygen Saturation is 95% on RA, adequate by my interpretation.    COORDINATION OF CARE:  12:12 PM Will order and prescribe prednisone, benadryl.  Return precautions advised.  Patient acknowledges and agrees with plan.       MDM   Final diagnoses:  Contact dermatitis    Pt well appearing.  Non-toxic.  Airway patent.  Rash appears c/w dermatitis.  Pt agrees with tx plan and  Advised to return for any worsening sx's  I personally performed the services described in this documentation, which was scribed in my presence. The recorded information has been reviewed and is accurate.    Pauline Ausammy Azyiah Bo, PA-C 02/05/15 2024  Jerelyn ScottMartha Linker, MD 02/12/15 [phone]

## 2015-02-03 NOTE — Discharge Instructions (Signed)

## 2015-02-05 ENCOUNTER — Emergency Department (HOSPITAL_COMMUNITY)
Admission: EM | Admit: 2015-02-05 | Discharge: 2015-02-05 | Disposition: A | Discharge status: Home / Self Care | Payer: Medicaid Other | Attending: Emergency Medicine | Admitting: Emergency Medicine

## 2015-02-05 ENCOUNTER — Encounter (HOSPITAL_COMMUNITY): Payer: Self-pay

## 2015-02-05 DIAGNOSIS — R21 Rash and other nonspecific skin eruption: Secondary | ICD-10-CM | POA: Diagnosis not present

## 2015-02-05 DIAGNOSIS — Z8742 Personal history of other diseases of the female genital tract: Secondary | ICD-10-CM | POA: Insufficient documentation

## 2015-02-05 DIAGNOSIS — R220 Localized swelling, mass and lump, head: Secondary | ICD-10-CM | POA: Insufficient documentation

## 2015-02-05 DIAGNOSIS — E119 Type 2 diabetes mellitus without complications: Secondary | ICD-10-CM | POA: Insufficient documentation

## 2015-02-05 DIAGNOSIS — Z8619 Personal history of other infectious and parasitic diseases: Secondary | ICD-10-CM | POA: Insufficient documentation

## 2015-02-05 DIAGNOSIS — Z8632 Personal history of gestational diabetes: Secondary | ICD-10-CM | POA: Diagnosis not present

## 2015-02-05 MED ORDER — LIDOCAINE HCL (PF) 1 % IJ SOLN
INTRAMUSCULAR | Status: AC
  Filled 2015-02-05: qty 5

## 2015-02-05 MED ORDER — AMOXICILLIN-POT CLAVULANATE 875-125 MG PO TABS: 1.0000 | ORAL_TABLET | Freq: Two times a day (BID) | ORAL | Status: DC

## 2015-02-05 MED ORDER — CEFTRIAXONE SODIUM 1 G IJ SOLR
1.0000 g | Freq: Once | INTRAMUSCULAR | Status: AC
  Administered 2015-02-05: 1 g via INTRAMUSCULAR
  Filled 2015-02-05: qty 10

## 2015-02-05 MED ORDER — LIDOCAINE HCL (PF) 1 % IJ SOLN
INTRAMUSCULAR | Status: AC
  Administered 2015-02-05: 2.1 mL
  Filled 2015-02-05: qty 5

## 2015-02-05 NOTE — ED Provider Notes (Signed)
CSN: [id number]     Arrival date & time 02/05/15  0818 History   First MD Initiated Contact with Patient 02/05/15 [phone]     Chief Complaint  Patient presents with  . Facial Swelling     (Consider location/radiation/quality/duration/timing/severity/associated sxs/prior Treatment) HPI   [name] is a 29 y.o. female who presents to the Emergency Department complaining of persistent rash to the left face.  She was seen here two days ago by me for same and treated with benadryl and prednisone.  She returns today due to swelling of her face and she states the rash is continuing to spread.  She denies fever, sore throat, neck pain, malaise and pain.      Past Medical History  Diagnosis Date  . Medical history non-contributory   . Diabetes mellitus without complication (HCC)   . Gestational diabetes   . Vaginal bleeding between periods 01/09/2015  . Vaginal discharge 01/09/2015  . Friable cervix 01/09/2015  . BV (bacterial vaginosis) 01/09/2015  . Menstrual cramps 01/09/2015   Past Surgical History  Procedure Laterality Date  . Orthopedic surgery    . Tubal ligation Bilateral 01/02/2013    Procedure: POST PARTUM TUBAL LIGATION;  Surgeon: Catalina Antigua, MD;  Location: [facility];  Service: Gynecology;  Laterality: Bilateral;  . Cholecystectomy N/A 10/30/2013    Procedure: LAPAROSCOPIC CHOLECYSTECTOMY;  Surgeon: Dalia Heading, MD;  Location: [facility];  Service: General;  Laterality: N/A;   Family History  Problem Relation Age of Onset  . Hypertension Father   . Cancer Maternal Uncle     lung  . Diabetes Maternal Grandmother   . Diabetes Maternal Grandfather   . Cancer Maternal Grandfather     lung  . Arthritis Mother    Social History  Substance Use Topics  . Smoking status: Never Smoker   . Smokeless tobacco: Never Used  . Alcohol Use: Yes     Comment: OCC   OB History    Gravida Para Term Preterm AB TAB SAB Ectopic Multiple Living   Review of Systems   Constitutional: Negative for fever, chills, activity change and appetite change.  HENT: Positive for facial swelling. Negative for sore throat and trouble swallowing.   Respiratory: Negative for chest tightness, shortness of breath and wheezing.   Gastrointestinal: Negative for nausea and vomiting.  Musculoskeletal: Negative for neck pain and neck stiffness.  Skin: Positive for rash. Negative for wound.  Neurological: Negative for dizziness, weakness, numbness and headaches.  All other systems reviewed and are negative.     Allergies  Shellfish allergy  Home Medications   Prior to Admission medications   Medication Sig Start Date End Date Taking? Authorizing Provider  ibuprofen (ADVIL,MOTRIN) 800 MG tablet Take 1 tablet (800 mg total) by mouth every 8 (eight) hours as needed. 01/09/15   Adline Potter, NP  metroNIDAZOLE (METROGEL VAGINAL) 0.75 % vaginal gel Use 1 applicator in vagina for 5 nights 01/09/15   Adline Potter, NP  predniSONE (DELTASONE) 10 MG tablet Take 6 tablets day one, 5 tablets day two, 4 tablets day three, 3 tablets day four, 2 tablets day five, then 1 tablet day six 02/03/15   Brittannie Tawney, PA-C   BP 119/85 mmHg  Pulse 72  Temp(Src) 97.7 F (36.5 C)  Resp 16  Ht  (1.549 m)  Wt 201 lb (91.173 kg)  BMI 38.00 kg/m2  SpO2 99%  LMP 01/20/2015 Physical Exam  Constitutional: She is oriented to person, place, and time. She appears well-developed and well-nourished. No distress.  HENT:  Head: Normocephalic and atraumatic.  Mouth/Throat: Oropharynx is clear and moist.  Neck: Normal range of motion. Neck supple.  Cardiovascular: Normal rate, regular rhythm, normal heart sounds and intact distal pulses.   No murmur heard. Pulmonary/Chest: Effort normal and breath sounds normal. No respiratory distress.  Musculoskeletal: She exhibits no edema or tenderness.  Lymphadenopathy:    She has no cervical adenopathy.  Neurological: She is alert and oriented  to person, place, and time. She exhibits normal muscle tone. Coordination normal.  Skin: Skin is warm. Rash noted. There is erythema.  Erythematous maculopapular rash to the left face, now oozing serous fluid and crusting.  No involvement of the eye.  No significant edema.  Nursing note and vitals reviewed.   ED Course  Procedures (including critical care time)   MDM   Final diagnoses:  Rash of face    Patient is well-appearing. Vital signs are stable. Airway is patent. She is nontoxic appearing.  Patient also seen by Dr. Fayrene FearingJames, care plan discussed with includes IM rocephin and rx for augmentin  Derm referral also given    Pauline Ausammy Brithney Bensen, PA-C 02/06/15 1741  Rolland PorterMark James, MD 02/27/15 [phone]

## 2015-02-05 NOTE — ED Provider Notes (Signed)
Patient seen and evaluated. History of rash over 4-5 days. Placed on prednisone and Benadryl 2 days ago but was worsening. Exam shows erythema with vesicles are ruptured with honey crusting throughout left face. Not frankly dermatomal. Not painful. Does not have fever or other systemic signs of infection. duration. Plan is IM Rocephin, by mouth Augmentin for probable appetite. Does not have marked erythema or well-demarcated edges to suggest erysipelas. Patient does not appear toxic.  Rolland PorterMark Azelyn Batie, MD 02/05/15 0900

## 2015-02-05 NOTE — ED Notes (Addendum)
Pt has red rash to left side of face, left ear, left side of neck and left shoulder. Left ear has oozing yellow drainage. Pt reports it started Sunday and was seen here in [facility] for this and given Prednisone. Pt reports she has been taking the Prednisone and Benadryl at home but it has continued to spread. Pt reports the rash on her face is getting better but it is moving more towards the lateral side of face, ear, neck, and shoulder. Denies visual or hearing difficulties.

## 2015-02-05 NOTE — ED Notes (Signed)
Pt reports was diagnosed with contact dermatitis Sunday and was given prednisone and has been taking benadryl as well.  Pt says now left side of face is swollen.

## 2015-02-05 NOTE — ED Notes (Signed)
PA at bedside.

## 2015-06-11 ENCOUNTER — Ambulatory Visit: Payer: Medicaid Other | Admitting: Adult Health

## 2015-08-14 ENCOUNTER — Encounter: Payer: Self-pay | Admitting: Orthopaedic Surgery

## 2015-08-14 ENCOUNTER — Ambulatory Visit (INDEPENDENT_AMBULATORY_CARE_PROVIDER_SITE_OTHER): Payer: Self-pay | Admitting: Orthopaedic Surgery

## 2015-08-14 ENCOUNTER — Ambulatory Visit (INDEPENDENT_AMBULATORY_CARE_PROVIDER_SITE_OTHER): Payer: Self-pay

## 2015-08-14 VITALS — BP 118/74 | HR 79 | Temp 97.9°F | Resp 16 | Ht 62.0 in | Wt 200.0 lb

## 2015-08-14 DIAGNOSIS — M79671 Pain in right foot: Secondary | ICD-10-CM

## 2015-08-14 DIAGNOSIS — S92911A Unspecified fracture of right toe(s), initial encounter for closed fracture: Secondary | ICD-10-CM

## 2015-08-14 NOTE — Progress Notes (Signed)
Subjective: my fourth toe hurts.  I have had heel spur    Patient ID: [name], female    DOB: 1985/05/15, 30 y.o.   MRN: [id number]  Foot Pain This is a new problem. The current episode started 1 to 4 weeks ago. The problem occurs intermittently. The problem has been gradually improving. Pertinent negatives include no congestion or coughing. The symptoms are aggravated by standing. She has tried acetaminophen, ice and position changes for the symptoms. The treatment provided moderate relief.   She has had right fourth toe pain for two weeks.  She has noticed some swelling and slight pain.  She does not remember any incident or accident.  She is not getting any better and wanted to have it checked out.  She has had heel pain on and off for years.  It is not hurting today.  She has no redness, no numbness.   Review of Systems  HENT: Negative for congestion.   Respiratory: Negative for cough and shortness of breath.   Cardiovascular: Negative for leg swelling.  Endocrine: Negative for cold intolerance.  Musculoskeletal: Positive for gait problem.  Allergic/Immunologic: Negative for environmental allergies.   Past Medical History  Diagnosis Date  . Medical history non-contributory   . Diabetes mellitus without complication (HCC)   . Gestational diabetes   . Vaginal bleeding between periods 01/09/2015  . Vaginal discharge 01/09/2015  . Friable cervix 01/09/2015  . BV (bacterial vaginosis) 01/09/2015  . Menstrual cramps 01/09/2015    Past Surgical History  Procedure Laterality Date  . Orthopedic surgery    . Tubal ligation Bilateral 01/02/2013    Procedure: POST PARTUM TUBAL LIGATION;  Surgeon: Catalina Antigua, MD;  Location: [facility];  Service: Gynecology;  Laterality: Bilateral;  . Cholecystectomy N/A 10/30/2013    Procedure: LAPAROSCOPIC CHOLECYSTECTOMY;  Surgeon: Dalia Heading, MD;  Location: [facility];  Service: General;  Laterality: N/A;    No current outpatient prescriptions on  file prior to visit.   No current facility-administered medications on file prior to visit.    Social History   Social History  . Marital Status: Single    Spouse Name: N/A  . Number of Children: N/A  . Years of Education: N/A   Occupational History  . Not on file.   Social History Main Topics  . Smoking status: Never Smoker   . Smokeless tobacco: Never Used  . Alcohol Use: Yes     Comment: OCC  . Drug Use: No  . Sexual Activity: Yes    Birth Control/ Protection: Surgical     Comment: tubal   Other Topics Concern  . Not on file   Social History Narrative    BP 118/74 mmHg  Pulse 79  Temp(Src) 97.9 F (36.6 C)  Resp 16  Ht  (1.575 m)  Wt 200 lb (90.719 kg)  BMI 36.57 kg/m2  LMP 08/07/2015 (Approximate)     Objective:   Physical Exam  Constitutional: She is oriented to person, place, and time. She appears well-developed and well-nourished.  HENT:  Head: Normocephalic and atraumatic.  Eyes: Conjunctivae and EOM are normal. Pupils are equal, round, and reactive to light.  Neck: Normal range of motion. Neck supple.  Cardiovascular: Normal rate, regular rhythm and intact distal pulses.   Pulmonary/Chest: Effort normal.  Abdominal: Soft.  Musculoskeletal: She exhibits tenderness (Swelling of the right little toe, no redness, no wound.  NV intact.  ROM full but tender.  No swelling of the foot.  Gait is good.).       Feet:  Neurological: She is alert and oriented to person, place, and time. She displays normal reflexes. No cranial nerve deficit. She exhibits normal muscle tone. Coordination normal.  Skin: Skin is warm and dry.  Psychiatric: She has a normal mood and affect. Her behavior is normal. Judgment and thought content normal.   X-rays were done of the right foot, reported separately.     Assessment & Plan:   Encounter Diagnoses  Name Primary?  . Fracture, toe, right, closed, initial encounter   . Right foot pain Yes   She was told of the  findings of the x-rays.  She was shown how to buddy tape the toes.  She does not want a post op shoe.  I will see her in two weeks with x-rays then of the fourth toe on the right.  Call if any problem. Precautions discussed.

## 2015-08-28 ENCOUNTER — Ambulatory Visit: Payer: Self-pay | Admitting: Orthopaedic Surgery

## 2016-12-25 ENCOUNTER — Encounter (HOSPITAL_COMMUNITY): Payer: Self-pay | Admitting: Emergency Medicine

## 2016-12-25 ENCOUNTER — Emergency Department (HOSPITAL_COMMUNITY)
Admission: EM | Admit: 2016-12-25 | Discharge: 2016-12-25 | Disposition: A | Discharge status: Home / Self Care | Payer: Medicaid Other | Attending: Emergency Medicine | Admitting: Emergency Medicine

## 2016-12-25 DIAGNOSIS — E119 Type 2 diabetes mellitus without complications: Secondary | ICD-10-CM | POA: Insufficient documentation

## 2016-12-25 DIAGNOSIS — R0981 Nasal congestion: Secondary | ICD-10-CM | POA: Diagnosis present

## 2016-12-25 DIAGNOSIS — J0100 Acute maxillary sinusitis, unspecified: Secondary | ICD-10-CM | POA: Diagnosis not present

## 2016-12-25 MED ORDER — AMOXICILLIN 500 MG PO CAPS: 500.0000 mg | ORAL_CAPSULE | Freq: Three times a day (TID) | ORAL | 0 refills | Status: AC

## 2016-12-25 NOTE — Discharge Instructions (Signed)
Return if any problems.

## 2016-12-25 NOTE — ED Triage Notes (Signed)
Pt c/o facial pain and congestion x 2 days. Cough today.

## 2016-12-26 NOTE — ED Provider Notes (Signed)
[facility] Provider Note   CSN: [id number] Arrival date & time: 12/25/16  1254     History   Chief Complaint Chief Complaint  Patient presents with  . Nasal Congestion    HPI [name] is a 31 y.o. female.  The history is provided by the patient. No language interpreter was used.  Cough  This is a new problem. The current episode started more than 2 days ago. The problem occurs constantly. The cough is non-productive. Associated symptoms include ear congestion and rhinorrhea. The treatment provided no relief. She is not a smoker. Her past medical history does not include COPD or asthma.    Past Medical History:  Diagnosis Date  . BV (bacterial vaginosis) 01/09/2015  . Diabetes mellitus without complication (HCC)   . Friable cervix 01/09/2015  . Gestational diabetes   . Medical history non-contributory   . Menstrual cramps 01/09/2015  . Vaginal bleeding between periods 01/09/2015  . Vaginal discharge 01/09/2015    Patient Active Problem List   Diagnosis Date Noted  . Vaginal bleeding between periods 01/09/2015  . Vaginal discharge 01/09/2015  . Friable cervix 01/09/2015  . BV (bacterial vaginosis) 01/09/2015  . Menstrual cramps 01/09/2015  . Cholecystitis 10/27/2013  . Pancreatitis, gallstone 10/27/2013  . Status post tubal ligation at time of delivery, current hospitalization 01/03/2013  . Status post vaginal delivery 01/03/2013    Past Surgical History:  Procedure Laterality Date  . CHOLECYSTECTOMY N/A 10/30/2013   Procedure: LAPAROSCOPIC CHOLECYSTECTOMY;  Surgeon: Dalia Heading, MD;  Location: [facility];  Service: General;  Laterality: N/A;  . ORTHOPEDIC SURGERY    . TUBAL LIGATION Bilateral 01/02/2013   Procedure: POST PARTUM TUBAL LIGATION;  Surgeon: Catalina Antigua, MD;  Location: [facility];  Service: Gynecology;  Laterality: Bilateral;    OB History    Gravida Para Term Preterm AB Living   SAB TAB Ectopic Multiple Live Births    2       Home Medications    Prior to Admission medications   Medication Sig Start Date End Date Taking? Authorizing Provider  amoxicillin (AMOXIL) 500 MG capsule Take 1 capsule (500 mg total) by mouth 3 (three) times daily. 12/25/16   Elson Areas, PA-C    Family History Family History  Problem Relation Age of Onset  . Hypertension Father   . Cancer Maternal Uncle        lung  . Diabetes Maternal Grandmother   . Diabetes Maternal Grandfather   . Cancer Maternal Grandfather        lung  . Arthritis Mother     Social History Social History  Substance Use Topics  . Smoking status: Never Smoker  . Smokeless tobacco: Never Used  . Alcohol use Yes     Comment: OCC     Allergies   Shellfish allergy   Review of Systems Review of Systems  HENT: Positive for rhinorrhea.   Respiratory: Positive for cough.   All other systems reviewed and are negative.    Physical Exam Updated Vital Signs BP 123/71 (BP Location: Right Arm)   Pulse 67   Temp 98 F (36.7 C) (Oral)   Resp 16   Ht  (1.549 m)   Wt 90.7 kg (200 lb)   LMP 12/21/2016   SpO2 100%   BMI 37.79 kg/m   Physical Exam  Constitutional: She is oriented to person, place, and time. She appears well-developed and  well-nourished.  HENT:  Head: Normocephalic and atraumatic.  Mouth/Throat: Posterior oropharyngeal erythema present.  Tender maxillary sinuses,  Eyes: Pupils are equal, round, and reactive to light. Conjunctivae and EOM are normal.  Neck: Normal range of motion. Neck supple.  Pulmonary/Chest: Effort normal.  Abdominal: Soft.  Musculoskeletal: Normal range of motion.  Neurological: She is alert and oriented to person, place, and time.  Skin: Skin is warm and dry.  Psychiatric: She has a normal mood and affect.     ED Treatments / Results  Labs (all labs ordered are listed, but only abnormal results are displayed) Labs Reviewed - No data to display  EKG  EKG Interpretation None        Radiology No results found.  Procedures Procedures (including critical care time)  Medications Ordered in ED Medications - No data to display   Initial Impression / Assessment and Plan / ED Course  I have reviewed the triage vital signs and the nursing notes.  Pertinent labs & imaging results that were available during my care of the patient were reviewed by me and considered in my medical decision making (see chart for details).    Pt reports she has had sinus infections in the past.  Pt reports this feels the same.   Final Clinical Impressions(s) / ED Diagnoses   Final diagnoses:  Acute non-recurrent maxillary sinusitis    New Prescriptions Discharge Medication List as of 12/25/2016  1:48 PM    START taking these medications   Details  amoxicillin (AMOXIL) 500 MG capsule Take 1 capsule (500 mg total) by mouth 3 (three) times daily., Starting Fri 12/25/2016, Print      An After Visit Summary was printed and given to the patient.   Elson Areas, [place] 12/26/16 0915    Loren Racer, MD 12/28/16 [phone]

## 2017-01-03 ENCOUNTER — Emergency Department (HOSPITAL_COMMUNITY)
Admission: EM | Admit: 2017-01-03 | Discharge: 2017-01-03 | Disposition: A | Discharge status: Home / Self Care | Payer: Medicaid Other | Attending: Emergency Medicine | Admitting: Emergency Medicine

## 2017-01-03 ENCOUNTER — Encounter (HOSPITAL_COMMUNITY): Payer: Self-pay | Admitting: Emergency Medicine

## 2017-01-03 DIAGNOSIS — R21 Rash and other nonspecific skin eruption: Secondary | ICD-10-CM | POA: Diagnosis present

## 2017-01-03 DIAGNOSIS — E119 Type 2 diabetes mellitus without complications: Secondary | ICD-10-CM | POA: Insufficient documentation

## 2017-01-03 DIAGNOSIS — T7840XA Allergy, unspecified, initial encounter: Secondary | ICD-10-CM | POA: Diagnosis not present

## 2017-01-03 MED ORDER — DEXAMETHASONE SODIUM PHOSPHATE 4 MG/ML IJ SOLN
8.0000 mg | Freq: Once | INTRAMUSCULAR | Status: AC
  Administered 2017-01-03: 8 mg via INTRAVENOUS
  Filled 2017-01-03: qty 2

## 2017-01-03 NOTE — ED Triage Notes (Signed)
Pt reports was prescribed amoxicillin for sinus infection. Pt reports finished abx on Wednesday and reports generalized rash appeared two days later. Pt alert and oriented. Airway patent. nad noted. Pt denies shortness of breath.

## 2017-01-03 NOTE — ED Provider Notes (Signed)
[facility] Provider Note   CSN: [id number] Arrival date & time: 01/03/17  2956     History   Chief Complaint Chief Complaint  Patient presents with  . Rash    HPI [name] is a 31 y.o. female.  Pruritic erythematous rash after taking amoxicillin for a sinus infection. No airway problems. Breathing normal. She is taking over-the-counter antihistamines with moderate relief. Severity of symptoms is mild to moderate. She no longer has sinus pain or congestion.      Past Medical History:  Diagnosis Date  . BV (bacterial vaginosis) 01/09/2015  . Diabetes mellitus without complication (HCC)   . Friable cervix 01/09/2015  . Gestational diabetes   . Medical history non-contributory   . Menstrual cramps 01/09/2015  . Vaginal bleeding between periods 01/09/2015  . Vaginal discharge 01/09/2015    Patient Active Problem List   Diagnosis Date Noted  . Vaginal bleeding between periods 01/09/2015  . Vaginal discharge 01/09/2015  . Friable cervix 01/09/2015  . BV (bacterial vaginosis) 01/09/2015  . Menstrual cramps 01/09/2015  . Cholecystitis 10/27/2013  . Pancreatitis, gallstone 10/27/2013  . Status post tubal ligation at time of delivery, current hospitalization 01/03/2013  . Status post vaginal delivery 01/03/2013    Past Surgical History:  Procedure Laterality Date  . CHOLECYSTECTOMY N/A 10/30/2013   Procedure: LAPAROSCOPIC CHOLECYSTECTOMY;  Surgeon: Dalia Heading, MD;  Location: [facility];  Service: General;  Laterality: N/A;  . ORTHOPEDIC SURGERY    . TUBAL LIGATION Bilateral 01/02/2013   Procedure: POST PARTUM TUBAL LIGATION;  Surgeon: Catalina Antigua, MD;  Location: [facility];  Service: Gynecology;  Laterality: Bilateral;    OB History    Gravida Para Term Preterm AB Living   SAB TAB Ectopic Multiple Live Births           2       Home Medications    Prior to Admission medications   Medication Sig Start Date End Date Taking? Authorizing  Provider  amoxicillin (AMOXIL) 500 MG capsule Take 1 capsule (500 mg total) by mouth 3 (three) times daily. 12/25/16   Elson Areas, PA-C    Family History Family History  Problem Relation Age of Onset  . Hypertension Father   . Cancer Maternal Uncle        lung  . Diabetes Maternal Grandmother   . Diabetes Maternal Grandfather   . Cancer Maternal Grandfather        lung  . Arthritis Mother     Social History Social History  Substance Use Topics  . Smoking status: Never Smoker  . Smokeless tobacco: Never Used  . Alcohol use Yes     Comment: OCC     Allergies   Shellfish allergy   Review of Systems Review of Systems  All other systems reviewed and are negative.    Physical Exam Updated Vital Signs BP 115/60   Pulse 65   Temp 98.4 F (36.9 C) (Oral)   Resp 16   Ht  (1.549 m)   Wt 88.5 kg (195 lb)   LMP 12/21/2016   SpO2 100%   BMI 36.84 kg/m   Physical Exam  Constitutional: She is oriented to person, place, and time. She appears well-developed and well-nourished.  HENT:  Head: Normocephalic and atraumatic.  Eyes: Conjunctivae are normal.  Neck: Neck supple.  Cardiovascular: Normal rate and regular rhythm.   Pulmonary/Chest: Effort normal and breath sounds normal.  Abdominal: Soft. Bowel sounds are normal.  Musculoskeletal: Normal range of motion.  Neurological: She is alert and oriented to person, place, and time.  Skin:  Erythematous macular rash onboth arms and chest  Psychiatric: She has a normal mood and affect. Her behavior is normal.  Nursing note and vitals reviewed.    ED Treatments / Results  Labs (all labs ordered are listed, but only abnormal results are displayed) Labs Reviewed - No data to display  EKG  EKG Interpretation None       Radiology No results found.  Procedures Procedures (including critical care time)  Medications Ordered in ED Medications  dexamethasone (DECADRON) injection 8 mg (8 mg Intravenous  Given 01/03/17 1101)     Initial Impression / Assessment and Plan / ED Course  I have reviewed the triage vital signs and the nursing notes.  Pertinent labs & imaging results that were available during my care of the patient were reviewed by me and considered in my medical decision making (see chart for details).     Rash most likely represents allergic reaction to amoxicillin.   Will Rx Decadron 8 mg IM  Final Clinical Impressions(s) / ED Diagnoses   Final diagnoses:  Allergic reaction, initial encounter    New Prescriptions New Prescriptions   No medications on file     Donnetta Hutching, MD 01/03/17 1116

## 2017-01-03 NOTE — Discharge Instructions (Signed)
Stop amoxicillin. You can continue to take antihistamines as needed.

## 2017-03-30 ENCOUNTER — Telehealth: Payer: Self-pay | Admitting: Orthopedic Surgery

## 2017-03-30 NOTE — Telephone Encounter (Signed)
Patient called to inquire about appointment, said shoulder has been hurting, and asked about referral Vs. "coming in and paying for the visit" (out of pocket.)  Reviewed patient's insurance, [facility] and per Medicaid, verified [place] [facility] [place] [facility]. States looking for another primary care doctor.   Discussed referral process, and relayed that due to insurance, verified as active and current, providers must bill the insurer; therefore, must follow Medicaid protocol regarding billing, scheduling, and referral. Voiced understanding.

## 2017-05-06 ENCOUNTER — Other Ambulatory Visit: Payer: Self-pay

## 2017-05-06 ENCOUNTER — Encounter (HOSPITAL_COMMUNITY): Payer: Self-pay | Admitting: Emergency Medicine

## 2017-05-06 ENCOUNTER — Emergency Department (HOSPITAL_COMMUNITY): Payer: Medicaid Other

## 2017-05-06 ENCOUNTER — Emergency Department (HOSPITAL_COMMUNITY)
Admission: EM | Admit: 2017-05-06 | Discharge: 2017-05-07 | Disposition: A | Discharge status: Home / Self Care | Payer: Medicaid Other | Attending: Emergency Medicine | Admitting: Emergency Medicine

## 2017-05-06 DIAGNOSIS — E119 Type 2 diabetes mellitus without complications: Secondary | ICD-10-CM | POA: Diagnosis not present

## 2017-05-06 DIAGNOSIS — R319 Hematuria, unspecified: Secondary | ICD-10-CM | POA: Diagnosis present

## 2017-05-06 LAB — URINALYSIS, ROUTINE W REFLEX MICROSCOPIC
BILIRUBIN URINE: NEGATIVE
Bacteria, UA: NONE SEEN
Glucose, UA: NEGATIVE mg/dL
KETONES UR: 20 mg/dL — AB
Nitrite: NEGATIVE
PROTEIN: 100 mg/dL — AB
SPECIFIC GRAVITY, URINE: 1.024 (ref 1.005–1.030)
pH: 5 (ref 5.0–8.0)

## 2017-05-06 LAB — CBC WITH DIFFERENTIAL/PLATELET
BASOS ABS: 0 10*3/uL (ref 0.0–0.1)
BASOS PCT: 0 %
EOS ABS: 0.3 10*3/uL (ref 0.0–0.7)
EOS PCT: 3 %
HCT: 42.8 % (ref 36.0–46.0)
Hemoglobin: 13.9 g/dL (ref 12.0–15.0)
LYMPHS ABS: 3 10*3/uL (ref 0.7–4.0)
Lymphocytes Relative: 30 %
MCH: 30.9 pg (ref 26.0–34.0)
MCHC: 32.5 g/dL (ref 30.0–36.0)
MCV: 95.1 fL (ref 78.0–100.0)
Monocytes Absolute: 0.9 10*3/uL (ref 0.1–1.0)
Monocytes Relative: 9 %
NEUTROS PCT: 58 %
Neutro Abs: 5.8 10*3/uL (ref 1.7–7.7)
Platelets: 458 10*3/uL — ABNORMAL HIGH (ref 150–400)
RBC: 4.5 MIL/uL (ref 3.87–5.11)
RDW: 13 % (ref 11.5–15.5)
WBC: 10 10*3/uL (ref 4.0–10.5)

## 2017-05-06 LAB — COMPREHENSIVE METABOLIC PANEL
ALBUMIN: 4.2 g/dL (ref 3.5–5.0)
ALK PHOS: 75 U/L (ref 38–126)
ALT: 19 U/L (ref 14–54)
AST: 17 U/L (ref 15–41)
Anion gap: 12 (ref 5–15)
BUN: 16 mg/dL (ref 6–20)
CALCIUM: 9.3 mg/dL (ref 8.9–10.3)
CHLORIDE: 101 mmol/L (ref 101–111)
CO2: 24 mmol/L (ref 22–32)
CREATININE: 0.83 mg/dL (ref 0.44–1.00)
GFR calc Af Amer: >60 mL/min (ref >60)
GFR calc non Af Amer: >60 mL/min (ref >60)
GLUCOSE: 78 mg/dL (ref 65–99)
Potassium: 4 mmol/L (ref 3.5–5.1)
SODIUM: 137 mmol/L (ref 135–145)
Total Bilirubin: 0.8 mg/dL (ref 0.3–1.2)
Total Protein: 8.2 g/dL — ABNORMAL HIGH (ref 6.5–8.1)

## 2017-05-06 LAB — PREGNANCY, URINE: Preg Test, Ur: NEGATIVE

## 2017-05-06 NOTE — ED Triage Notes (Signed)
PT states she noticed blood in her urine this am with lower abdominal discomfort and lower back pain. PT also c/o continued headache over the past 3 days.

## 2017-05-06 NOTE — ED Notes (Signed)
Patient transported to CT 

## 2017-05-06 NOTE — ED Provider Notes (Signed)
Emergency Department Provider Note   I have reviewed the triage vital signs and the nursing notes.   HISTORY  Chief Complaint Hematuria   HPI [name] is a 32 y.o. female with PMH of DM, AUB, and BV since to the emergency department for evaluation of hematuria with lower back pain/cramping radiating to the flanks.  Patient denies any fevers or chills.  No dysuria, hesitancy, urgency.  No modifying factors to her discomfort.  Symptoms began today and she noticed that the urine was very dark red.  No history of kidney stone.  She denies any vaginal bleeding or discharge.  Her last menstrual period was April 19, 2017.    Past Medical History:  Diagnosis Date  . BV (bacterial vaginosis) 01/09/2015  . Diabetes mellitus without complication (HCC)   . Friable cervix 01/09/2015  . Gestational diabetes   . Medical history non-contributory   . Menstrual cramps 01/09/2015  . Vaginal bleeding between periods 01/09/2015  . Vaginal discharge 01/09/2015    Patient Active Problem List   Diagnosis Date Noted  . Vaginal bleeding between periods 01/09/2015  . Vaginal discharge 01/09/2015  . Friable cervix 01/09/2015  . BV (bacterial vaginosis) 01/09/2015  . Menstrual cramps 01/09/2015  . Cholecystitis 10/27/2013  . Pancreatitis, gallstone 10/27/2013  . Status post tubal ligation at time of delivery, current hospitalization 01/03/2013  . Status post vaginal delivery 01/03/2013    Past Surgical History:  Procedure Laterality Date  . CHOLECYSTECTOMY N/A 10/30/2013   Procedure: LAPAROSCOPIC CHOLECYSTECTOMY;  Surgeon: Dalia Heading, MD;  Location: [facility];  Service: General;  Laterality: N/A;  . ORTHOPEDIC SURGERY    . TUBAL LIGATION Bilateral 01/02/2013   Procedure: POST PARTUM TUBAL LIGATION;  Surgeon: Catalina Antigua, MD;  Location: [facility];  Service: Gynecology;  Laterality: Bilateral;    Current Outpatient Rx  . Order #: [id number] Class: Print    Allergies Shellfish allergy and  Amoxicillin  Family History  Problem Relation Age of Onset  . Hypertension Father   . Cancer Maternal Uncle        lung  . Diabetes Maternal Grandmother   . Diabetes Maternal Grandfather   . Cancer Maternal Grandfather        lung  . Arthritis Mother     Social History Social History   Tobacco Use  . Smoking status: Never Smoker  . Smokeless tobacco: Never Used  Substance Use Topics  . Alcohol use: Yes    Comment: OCC  . Drug use: No    Review of Systems  Constitutional: No fever/chills Eyes: No visual changes. ENT: No sore throat. Cardiovascular: Denies chest pain. Respiratory: Denies shortness of breath. Gastrointestinal: No abdominal pain.  No nausea, no vomiting.  No diarrhea.  No constipation. Genitourinary: Negative for dysuria. Positive hematuria and flank pain (bilateral).  Musculoskeletal: Negative for back pain. Skin: Negative for rash. Neurological: Negative for headaches, focal weakness or numbness.  10-point ROS otherwise negative.  ____________________________________________   PHYSICAL EXAM:  VITAL SIGNS: ED Triage Vitals  Enc Vitals Group     BP 05/06/17 1648 136/84     Pulse Rate 05/06/17 1648 75     Resp 05/06/17 1648 15     Temp 05/06/17 1648 98.1 F (36.7 C)     Temp Source 05/06/17 1648 Oral     SpO2 05/06/17 1648 99 %     Weight 05/06/17 1651 195 lb (88.5 kg)     Height 05/06/17 1651 5' (1.524 m)  Pain Score 05/06/17 1650 3   Constitutional: Alert and oriented. Well appearing and in no acute distress. Eyes: Conjunctivae are normal. Head: Atraumatic. Nose: No congestion/rhinnorhea. Mouth/Throat: Mucous membranes are moist.  Neck: No stridor.  Cardiovascular: Normal rate, regular rhythm. Good peripheral circulation. Grossly normal heart sounds.   Respiratory: Normal respiratory effort.  No retractions. Lungs CTAB. Gastrointestinal: Soft and nontender. No distention. Mild CVA tenderness on the left.  Musculoskeletal: No lower  extremity tenderness nor edema. No gross deformities of extremities. Neurologic:  Normal speech and language. No gross focal neurologic deficits are appreciated.  Skin:  Skin is warm, dry and intact. No rash noted.  ____________________________________________   LABS (all labs ordered are listed, but only abnormal results are displayed)  Labs Reviewed  URINALYSIS, ROUTINE W REFLEX MICROSCOPIC - Abnormal; Notable for the following components:      Result Value   Color, Urine [name] (*)    APPearance CLOUDY (*)    Hgb urine dipstick LARGE (*)    Ketones, ur 20 (*)    Protein, ur 100 (*)    Leukocytes, UA SMALL (*)    Squamous Epithelial / LPF 6-30 (*)    All other components within normal limits  CBC WITH DIFFERENTIAL/PLATELET - Abnormal; Notable for the following components:   Platelets 458 (*)    All other components within normal limits  COMPREHENSIVE METABOLIC PANEL - Abnormal; Notable for the following components:   Total Protein 8.2 (*)    All other components within normal limits  PREGNANCY, URINE   ____________________________________________  RADIOLOGY  Ct Renal Stone Study  Result Date: 05/06/2017 CLINICAL DATA:  Initial evaluation for hematuria, lower abdominal discomfort with back pain. Flank pain. EXAM: CT ABDOMEN AND PELVIS WITHOUT CONTRAST TECHNIQUE: Multidetector CT imaging of the abdomen and pelvis was performed following the standard protocol without IV contrast. COMPARISON:  Prior ultrasound from 10/27/2013. FINDINGS: Lower chest: Mild scattered subsegmental atelectatic changes present at the left lung base. Visualized lungs are otherwise clear. Hepatobiliary: Liver demonstrates a normal contrast enhanced appearance. Gallbladder surgically absent. No biliary dilatation. Pancreas: Pancreas within normal limits. Spleen: Spleen within normal limits. Adrenals/Urinary Tract: Adrenal glands are normal. Kidneys equal in size without nephrolithiasis or hydronephrosis. No  radiopaque calculi seen along the course of either renal collecting system. No hydroureter. Partially distended bladder within normal limits. No layering stones within the bladder lumen. Stomach/Bowel: Stomach within normal limits. No evidence for bowel obstruction. No acute inflammatory changes seen about the bowels. Appendix within normal limits. Vascular/Lymphatic: Intra-abdominal aorta of normal caliber. No adenopathy. Incidental note made of dual APCs. Reproductive: Uterus within normal limits. Ovaries within normal limits. No adnexal mass. Other: No free air or fluid. Small fat containing paraumbilical hernia noted. Mild misty stranding noted within the mid mesentery. Musculoskeletal: No acute osseus abnormality. No worrisome lytic or blastic osseous lesions. Symmetric sclerosis about the SI joints bilaterally. IMPRESSION: 1. No CT evidence for nephrolithiasis or obstructive uropathy. 2. No other acute intra-abdominal or pelvic process. Electronically Signed   By: Rise MuBenjamin  McClintock M.D.   On: 05/06/2017 22:43    ____________________________________________   PROCEDURES  Procedure(s) performed:   Procedures  None ____________________________________________   INITIAL IMPRESSION / ASSESSMENT AND PLAN / ED COURSE  Pertinent labs & imaging results that were available during my care of the patient were reviewed by me and considered in my medical decision making (see chart for details).  Patient presents to the emergency department for evaluation hematuria with lower back cramping pain and  radiation to bilateral flanks.  No UTI symptoms.  Last menstrual period was January 7th.  No history of kidney stones.  She has slight CVA tenderness to percussion on the left.  Plan for CT renal, urine pregnancy. UA is pending.   CTA resolved. No vaginal bleeding or discharge. Plan for Gyn and Urology f/u.   At this time, I do not feel there is any life-threatening condition present. I have reviewed  and discussed all results (EKG, imaging, lab, urine as appropriate), exam findings with patient. I have reviewed nursing notes and appropriate previous records.  I feel the patient is safe to be discharged home without further emergent workup. Discussed usual and customary return precautions. Patient and family (if present) verbalize understanding and are comfortable with this plan.  Patient will follow-up with their primary care provider. If they do not have a primary care provider, information for follow-up has been provided to them. All questions have been answered.  ____________________________________________  FINAL CLINICAL IMPRESSION(S) / ED DIAGNOSES  Final diagnoses:  Hematuria, unspecified type    Note:  This document was prepared using Dragon voice recognition software and may include unintentional dictation errors.  Alona Bene, MD Emergency Medicine    Anamaria Dusenbury, Arlyss Repress, MD 05/07/17 [phone]

## 2017-05-06 NOTE — Discharge Instructions (Signed)
Are seen in the emergency department today with blood in the urine.  There is no sign of infection.  Her CT scan is normal.  Please follow-up with your OB/GYN and urologist for further evaluation.  Return to the emergency department with any worsening pain, fever, vaginal discharge, bleeding, or other concerning symptoms.

## 2017-05-07 NOTE — ED Notes (Signed)
Pt ambulatory to waiting room. Pt verbalized understanding of discharge instructions.   

## 2018-03-26 IMAGING — CT CT RENAL STONE PROTOCOL
2 of 4 series · 16 of 46 positions shown, 18 images · non-contrast
Comparison: Prior ultrasound from 10/27/2013.

CLINICAL DATA: Initial evaluation for hematuria, lower abdominal
discomfort with back pain. Flank pain.

EXAM:
CT ABDOMEN AND PELVIS WITHOUT CONTRAST
TECHNIQUE: Multidetector CT imaging of the abdomen and pelvis was performed
following the standard protocol without IV contrast.

[Series 2: axial st · axial · 0.98mm/px · z∈[+1025,+1465]mm · 13 of 98 slices shown, 15 images]
[im 5/98  soft-tissue]
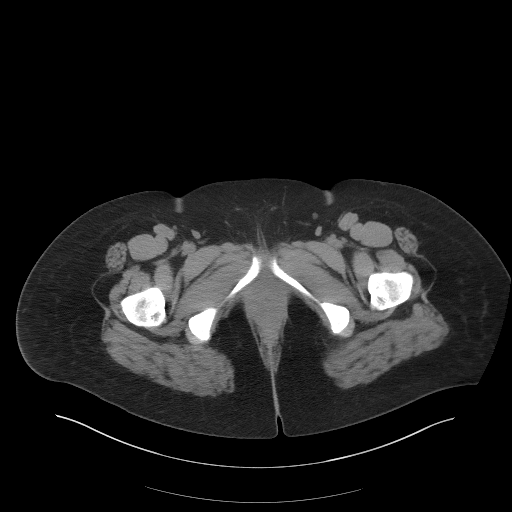
[im 5/98  bone]
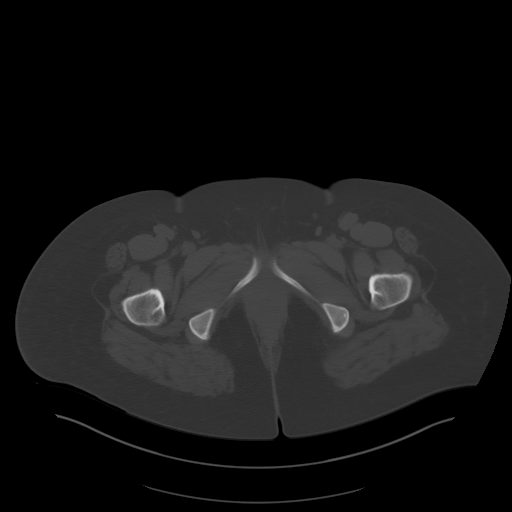
[im 13/98  soft-tissue]
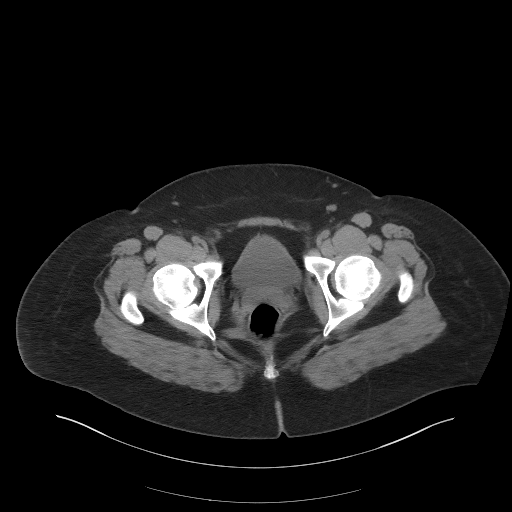
[im 21/98  soft-tissue]
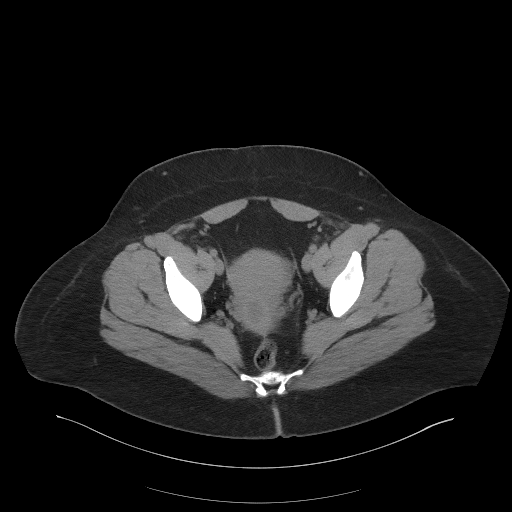
[im 29/98  soft-tissue]
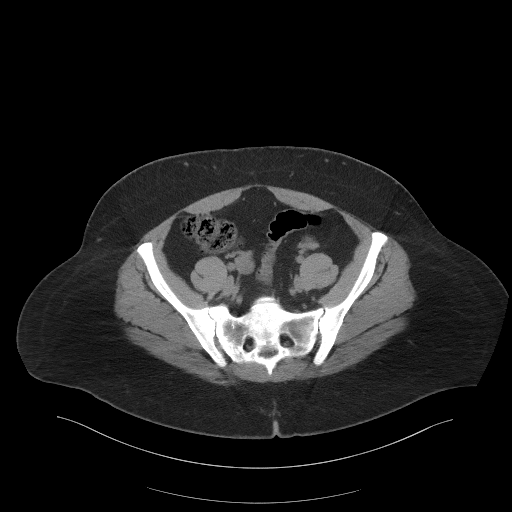
[im 33/98  soft-tissue]
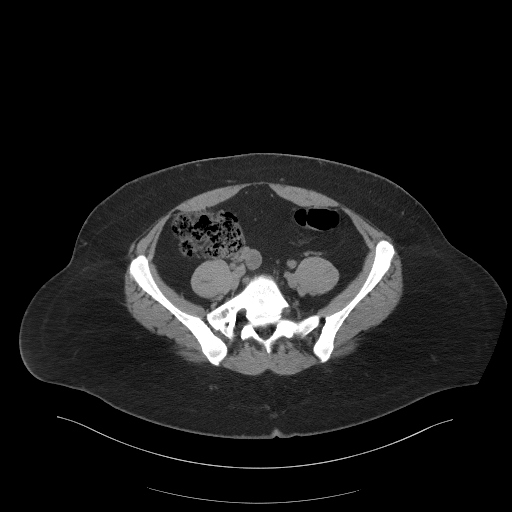
[im 41/98  soft-tissue]
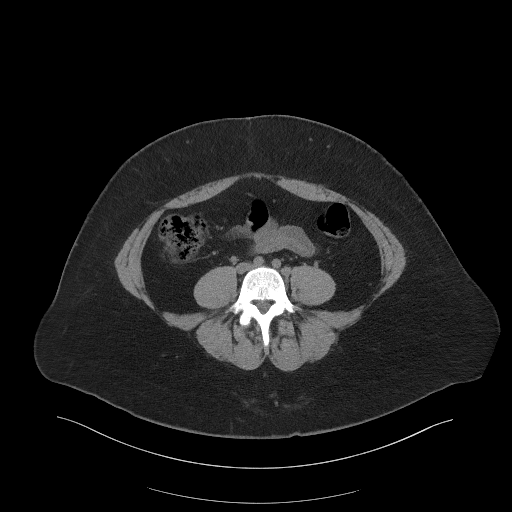
[im 49/98  soft-tissue]
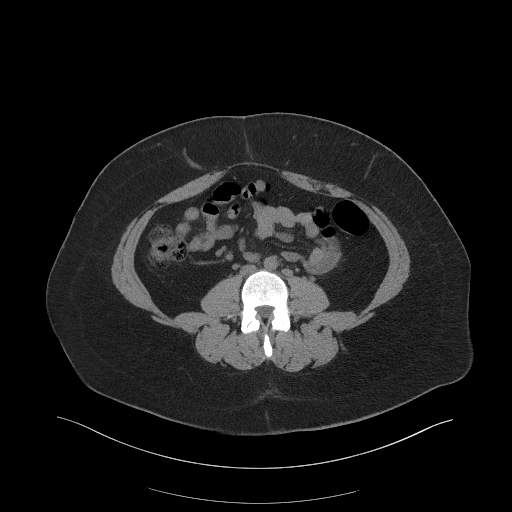
[im 57/98  soft-tissue]
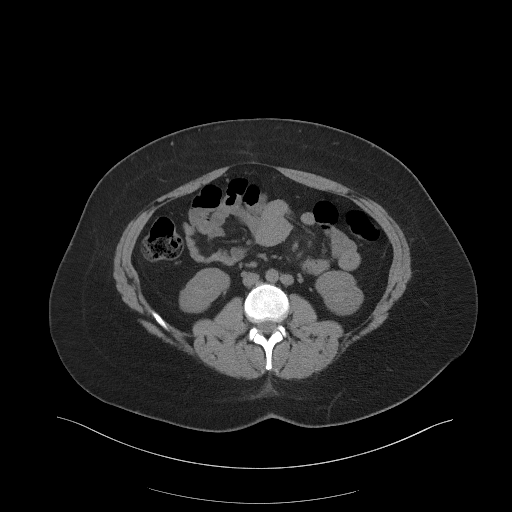
[im 65/98  soft-tissue]
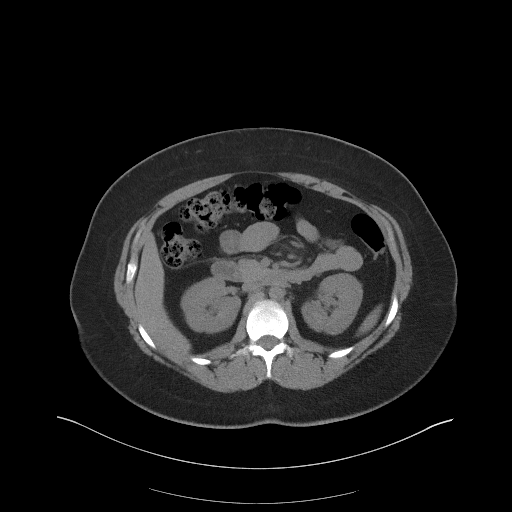
[im 65/98  bone]
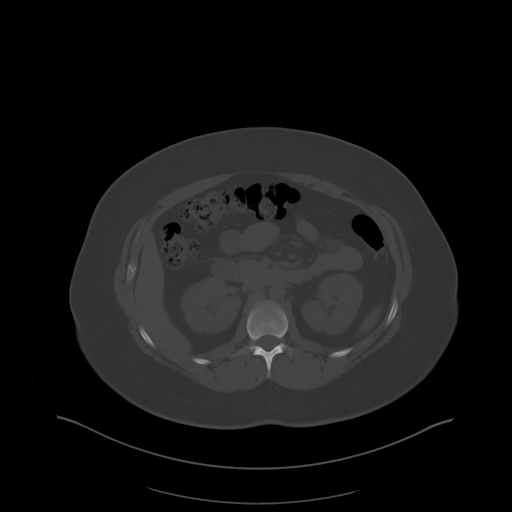
[im 69/98  soft-tissue]
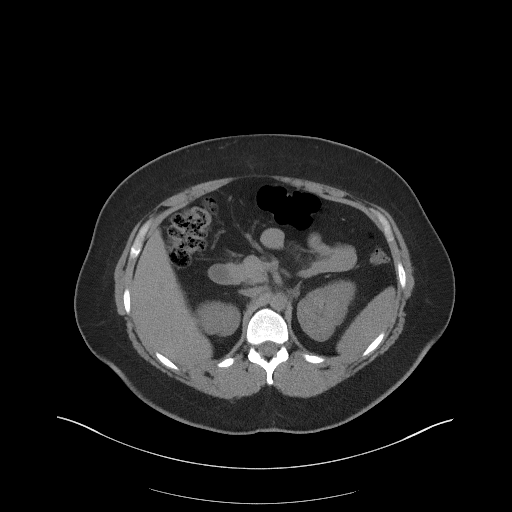
[im 77/98  soft-tissue]
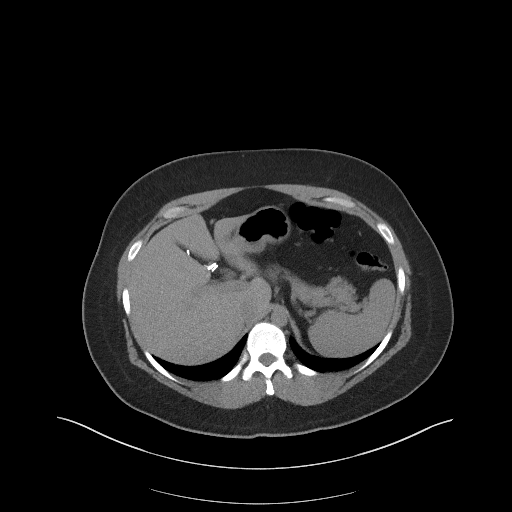
[im 85/98  soft-tissue]
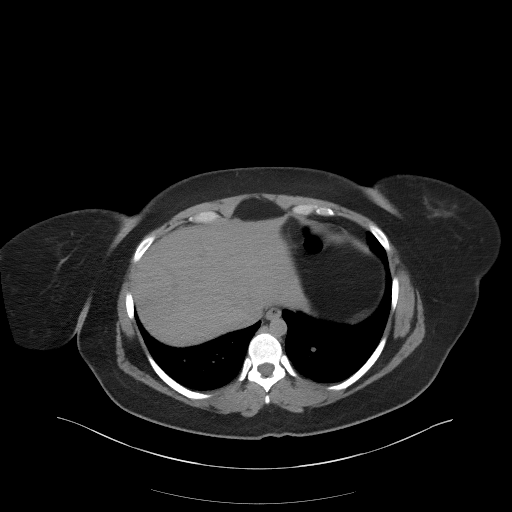
[im 93/98  soft-tissue]
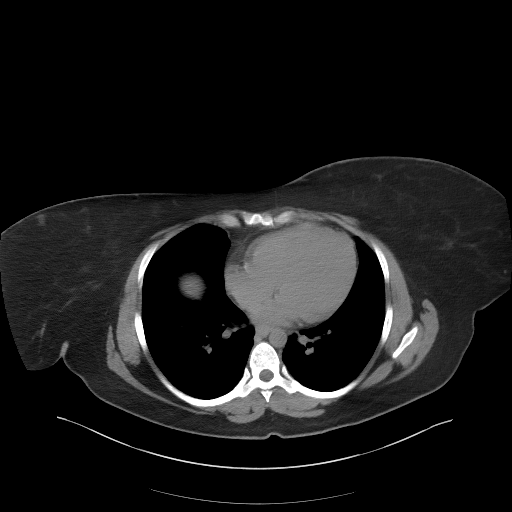

[Series 5: coronal st · coronal · 0.88mm/px · 3 of 101 slices shown]
[im 34/101  soft-tissue]
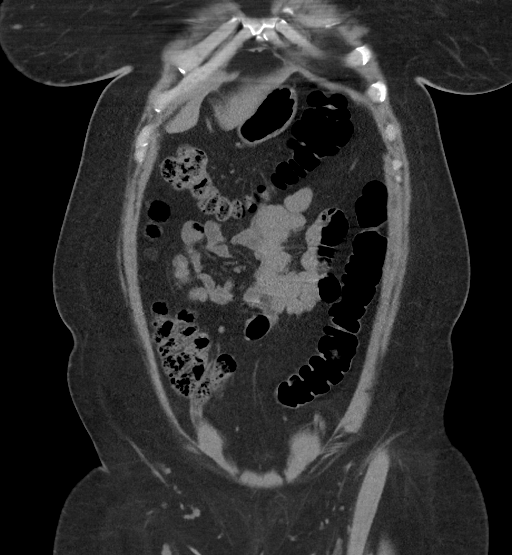
[im 45/101  soft-tissue]
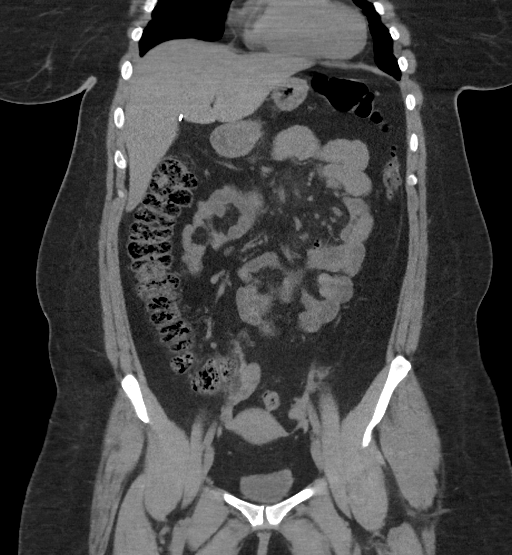
[im 56/101  soft-tissue]
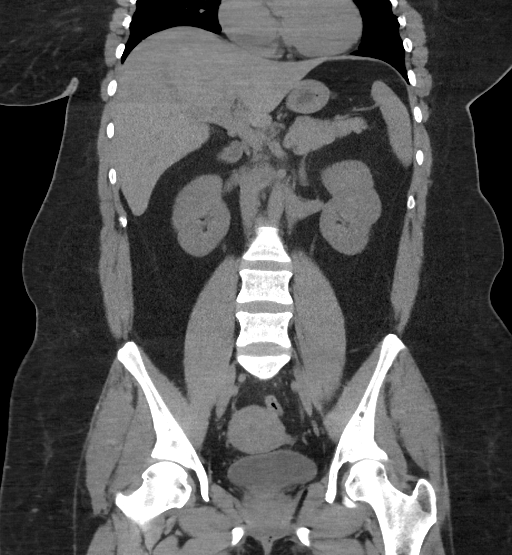

[16 of 46 positions shown; findings below may reference images not displayed]

FINDINGS: Lower chest: Mild scattered subsegmental atelectatic changes present
at the left lung base. Visualized lungs are otherwise clear.

Hepatobiliary: Liver demonstrates a normal contrast enhanced
appearance. Gallbladder surgically absent. No biliary dilatation.

Pancreas: Pancreas within normal limits.

Spleen: Spleen within normal limits.

Adrenals/Urinary Tract: Adrenal glands are normal. Kidneys equal in
size without nephrolithiasis or hydronephrosis. No radiopaque
calculi seen along the course of either renal collecting system. No
hydroureter. Partially distended bladder within normal limits. No
layering stones within the bladder lumen.

Stomach/Bowel: Stomach within normal limits. No evidence for bowel
obstruction. No acute inflammatory changes seen about the bowels.
Appendix within normal limits.

Vascular/Lymphatic: Intra-abdominal aorta of normal caliber. No
adenopathy. Incidental note made of dual APCs.

Reproductive: Uterus within normal limits. Ovaries within normal
limits. No adnexal mass.

Other: No free air or fluid. Small fat containing paraumbilical
hernia noted. Mild misty stranding noted within the mid mesentery.

Musculoskeletal: No acute osseus abnormality. No worrisome lytic or
blastic osseous lesions. Symmetric sclerosis about the SI joints
bilaterally.
IMPRESSION: 1. No CT evidence for nephrolithiasis or obstructive uropathy.
2. No other acute intra-abdominal or pelvic process.

## 2019-12-26 ENCOUNTER — Other Ambulatory Visit: Payer: Self-pay

## 2019-12-26 ENCOUNTER — Other Ambulatory Visit: Payer: Self-pay | Admitting: *Deleted

## 2019-12-26 DIAGNOSIS — Z20822 Contact with and (suspected) exposure to covid-19: Secondary | ICD-10-CM

## 2019-12-28 LAB — SARS-COV-2, NAA 2 DAY TAT

## 2019-12-28 LAB — SPECIMEN STATUS REPORT

## 2019-12-28 LAB — NOVEL CORONAVIRUS, NAA: SARS-CoV-2, NAA: NOT DETECTED

## 2021-04-07 ENCOUNTER — Emergency Department (HOSPITAL_COMMUNITY)
Admission: EM | Admit: 2021-04-07 | Discharge: 2021-04-07 | Disposition: A | Discharge status: Home / Self Care | Payer: Medicaid Other | Attending: Emergency Medicine | Admitting: Emergency Medicine

## 2021-04-07 DIAGNOSIS — J020 Streptococcal pharyngitis: Secondary | ICD-10-CM | POA: Diagnosis not present

## 2021-04-07 DIAGNOSIS — E119 Type 2 diabetes mellitus without complications: Secondary | ICD-10-CM | POA: Insufficient documentation

## 2021-04-07 DIAGNOSIS — Z20822 Contact with and (suspected) exposure to covid-19: Secondary | ICD-10-CM | POA: Insufficient documentation

## 2021-04-07 DIAGNOSIS — J029 Acute pharyngitis, unspecified: Secondary | ICD-10-CM | POA: Diagnosis present

## 2021-04-07 LAB — RESP PANEL BY RT-PCR (FLU A&B, COVID) ARPGX2
Influenza A by PCR: NEGATIVE
Influenza B by PCR: NEGATIVE
SARS Coronavirus 2 by RT PCR: NEGATIVE

## 2021-04-07 LAB — GROUP A STREP BY PCR: Group A Strep by PCR: DETECTED — AB

## 2021-04-07 MED ORDER — CLINDAMYCIN HCL 150 MG PO CAPS: 300.0000 mg | ORAL_CAPSULE | Freq: Three times a day (TID) | ORAL | 0 refills | Status: AC

## 2021-04-07 MED ORDER — DEXAMETHASONE 10 MG/ML FOR PEDIATRIC ORAL USE
10.0000 mg | Freq: Once | INTRAMUSCULAR | Status: AC
  Administered 2021-04-07: 20:00:00 10 mg via ORAL
  Filled 2021-04-07: qty 1

## 2021-04-07 NOTE — ED Provider Notes (Signed)
[facility] EMERGENCY DEPARTMENT Provider Note   CSN: [id number] Arrival date & time: 04/07/21  1530     History Chief Complaint  Patient presents with   Sore Throat    [name] is a 35 y.o. female. Patient presents the emergency department with her for sore throat for 1 day.  She states that her daughter recently had strep throat.  Her daughter did not test positive for strep throat, she just assumed that she had it.  Patient has a associated dry cough.  Denies any ear pain, fevers, chills, shortness of breath, chest pain, nasal congestion.   Sore Throat      Past Medical History:  Diagnosis Date   BV (bacterial vaginosis) 01/09/2015   Diabetes mellitus without complication (HCC)    Friable cervix 01/09/2015   Gestational diabetes    Medical history non-contributory    Menstrual cramps 01/09/2015   Vaginal bleeding between periods 01/09/2015   Vaginal discharge 01/09/2015    Patient Active Problem List   Diagnosis Date Noted   Vaginal bleeding between periods 01/09/2015   Vaginal discharge 01/09/2015   Friable cervix 01/09/2015   BV (bacterial vaginosis) 01/09/2015   Menstrual cramps 01/09/2015   Cholecystitis 10/27/2013   Pancreatitis, gallstone 10/27/2013   Status post tubal ligation at time of delivery, current hospitalization 01/03/2013   Status post vaginal delivery 01/03/2013    Past Surgical History:  Procedure Laterality Date   CHOLECYSTECTOMY N/A 10/30/2013   Procedure: LAPAROSCOPIC CHOLECYSTECTOMY;  Surgeon: Dalia Heading, MD;  Location: [facility];  Service: General;  Laterality: N/A;   ORTHOPEDIC SURGERY     TUBAL LIGATION Bilateral 01/02/2013   Procedure: POST PARTUM TUBAL LIGATION;  Surgeon: Catalina Antigua, MD;  Location: [facility];  Service: Gynecology;  Laterality: Bilateral;     OB History     Gravida  2   Para  2   Term  2   Preterm      AB      Living  2      SAB      IAB      Ectopic      Multiple      Live Births  2            Family History  Problem Relation Age of Onset   Hypertension Father    Cancer Maternal Uncle        lung   Diabetes Maternal Grandmother    Diabetes Maternal Grandfather    Cancer Maternal Grandfather        lung   Arthritis Mother     Social History   Tobacco Use   Smoking status: Never   Smokeless tobacco: Never  Vaping Use   Vaping Use: Never used  Substance Use Topics   Alcohol use: Yes    Comment: OCC   Drug use: No    Home Medications Prior to Admission medications   Medication Sig Start Date End Date Taking? Authorizing Provider  clindamycin (CLEOCIN) 150 MG capsule Take 2 capsules (300 mg total) by mouth 3 (three) times daily for 10 days. 04/07/21 04/17/21 Yes Guyla Bless, Finis Bud, PA-C  amoxicillin (AMOXIL) 500 MG capsule Take 1 capsule (500 mg total) by mouth 3 (three) times daily. 12/25/16   Elson Areas, PA-C    Allergies    Shellfish allergy and Amoxicillin  Review of Systems   Review of Systems  HENT:  Positive for sore throat.   Respiratory:  Positive for cough.  All other systems reviewed and are negative.  Physical Exam Updated Vital Signs BP 129/82    Pulse (!) 102    Temp 98 F (36.7 C) (Oral)    Resp 16    Ht 5\' 1"  (1.549 m)    Wt 90.7 kg    LMP 04/07/2021    SpO2 95%    BMI 37.79 kg/m   Physical Exam Vitals and nursing note reviewed.  Constitutional:      General: She is not in acute distress.    Appearance: She is not toxic-appearing.  HENT:     Head: Normocephalic and atraumatic.     Right Ear: Tympanic membrane and ear canal normal. No drainage, swelling or tenderness. No middle ear effusion. Tympanic membrane is not erythematous.     Left Ear: Tympanic membrane and ear canal normal. No drainage, swelling or tenderness.  No middle ear effusion. Tympanic membrane is not erythematous.     Nose: No congestion or rhinorrhea.     Mouth/Throat:     Mouth: Mucous membranes are moist.     Pharynx: Uvula midline. Oropharyngeal  exudate and posterior oropharyngeal erythema present. No uvula swelling.     Tonsils: Tonsillar exudate present. No tonsillar abscesses.  Eyes:     General: No scleral icterus.       Right eye: No discharge.        Left eye: No discharge.  Cardiovascular:     Rate and Rhythm: Normal rate and regular rhythm.     Heart sounds: Normal heart sounds. No murmur heard.   No friction rub. No gallop.  Pulmonary:     Effort: Pulmonary effort is normal. No respiratory distress.     Breath sounds: Normal breath sounds. No stridor. No wheezing, rhonchi or rales.  Chest:     Chest wall: No tenderness.  Neurological:     Mental Status: She is alert.  Psychiatric:        Mood and Affect: Mood normal.        Behavior: Behavior normal.    ED Results / Procedures / Treatments   Labs (all labs ordered are listed, but only abnormal results are displayed) Labs Reviewed  GROUP A STREP BY PCR - Abnormal; Notable for the following components:      Result Value   Group A Strep by PCR DETECTED (*)    All other components within normal limits  RESP PANEL BY RT-PCR (FLU A&B, COVID) [id number]    EKG None  Radiology No results found.  Procedures Procedures   Medications Ordered in ED Medications  dexamethasone (DECADRON) 10 MG/ML injection for Pediatric ORAL use 10 mg (10 mg Oral Given 04/07/21 2011)    ED Course  I have reviewed the triage vital signs and the nursing notes.  Pertinent labs & imaging results that were available during my care of the patient were reviewed by me and considered in my medical decision making (see chart for details).    MDM Rules/Calculators/A&P                           Patient is with sore throat for one day.  Initially, mildly tachycardic, afebrile, vitals otherwise stable Exam with extensive tonsillar exudates and erythema. No evidence of PTA. No trismus or hot potato voice. Strep, COVID, flu pending  Strep positive. Home with Clinda. Dex x 1 in  ED   Final Clinical Impression(s) / ED Diagnoses Final diagnoses:  Strep pharyngitis  Rx / DC Orders ED Discharge Orders          Ordered    clindamycin (CLEOCIN) 150 MG capsule  3 times daily        04/07/21 1945             [name] 04/07/21 2301    Benjiman Core, MD 04/08/21 [phone]

## 2021-04-07 NOTE — Discharge Instructions (Addendum)
You have tested positive for strep throat. Please return to the emergency department if your symptoms do not improve. I have prescribed you an antibiotic for 10 days. Please take as prescribed.  Please take all of your antibiotics until finished!   You may develop abdominal discomfort or diarrhea from the antibiotic.

## 2021-04-07 NOTE — ED Triage Notes (Signed)
Pt to ED c/o sore throat, reports daughter recently treated for strep throat. Marland Kitchen
